# Patient Record
Sex: Female | Born: 1935 | Race: White | Hispanic: No | Smoking: Former smoker
Health system: Southern US, Community
[De-identification: ages and names within clinical notes are randomized; demographics above are authoritative.]

## PROBLEM LIST (undated history)

## (undated) DIAGNOSIS — E039 Hypothyroidism, unspecified: Secondary | ICD-10-CM

## (undated) DIAGNOSIS — K224 Dyskinesia of esophagus: Secondary | ICD-10-CM

## (undated) DIAGNOSIS — I5033 Acute on chronic diastolic (congestive) heart failure: Secondary | ICD-10-CM

## (undated) DIAGNOSIS — B0229 Other postherpetic nervous system involvement: Secondary | ICD-10-CM

## (undated) DIAGNOSIS — J449 Chronic obstructive pulmonary disease, unspecified: Secondary | ICD-10-CM

## (undated) DIAGNOSIS — IMO0002 Reserved for concepts with insufficient information to code with codable children: Secondary | ICD-10-CM

## (undated) DIAGNOSIS — I10 Essential (primary) hypertension: Secondary | ICD-10-CM

## (undated) DIAGNOSIS — I480 Paroxysmal atrial fibrillation: Secondary | ICD-10-CM

## (undated) DIAGNOSIS — B3781 Candidal esophagitis: Secondary | ICD-10-CM

## (undated) DIAGNOSIS — J690 Pneumonitis due to inhalation of food and vomit: Secondary | ICD-10-CM

## (undated) DIAGNOSIS — M329 Systemic lupus erythematosus, unspecified: Secondary | ICD-10-CM

## (undated) HISTORY — DX: Paroxysmal atrial fibrillation: I48.0

## (undated) HISTORY — DX: Acute on chronic diastolic (congestive) heart failure: I50.33

## (undated) HISTORY — DX: Dyskinesia of esophagus: K22.4

## (undated) HISTORY — DX: Candidal esophagitis: B37.81

## (undated) HISTORY — DX: Pneumonitis due to inhalation of food and vomit: J69.0

## (undated) HISTORY — DX: Systemic lupus erythematosus, unspecified: M32.9

## (undated) HISTORY — DX: Reserved for concepts with insufficient information to code with codable children: IMO0002

---

## 1994-05-25 HISTORY — PX: APPENDECTOMY: SHX54

## 1999-01-14 ENCOUNTER — Emergency Department (HOSPITAL_COMMUNITY): Admission: EM | Admit: 1999-01-14 | Discharge: 1999-01-14 | Payer: Self-pay | Admitting: Emergency Medicine

## 1999-01-14 ENCOUNTER — Encounter: Payer: Self-pay | Admitting: Emergency Medicine

## 2000-07-01 ENCOUNTER — Encounter: Admission: RE | Admit: 2000-07-01 | Discharge: 2000-07-01 | Payer: Self-pay | Admitting: Family Medicine

## 2000-07-01 ENCOUNTER — Encounter: Payer: Self-pay | Admitting: Family Medicine

## 2000-09-13 ENCOUNTER — Encounter: Admission: RE | Admit: 2000-09-13 | Discharge: 2000-09-13 | Payer: Self-pay | Admitting: Family Medicine

## 2000-09-13 ENCOUNTER — Encounter: Payer: Self-pay | Admitting: Family Medicine

## 2001-06-24 ENCOUNTER — Encounter: Payer: Self-pay | Admitting: Orthopedic Surgery

## 2001-06-24 ENCOUNTER — Encounter: Payer: Self-pay | Admitting: Emergency Medicine

## 2001-06-24 ENCOUNTER — Inpatient Hospital Stay (HOSPITAL_COMMUNITY): Admission: EM | Admit: 2001-06-24 | Discharge: 2001-06-29 | Payer: Self-pay | Admitting: Emergency Medicine

## 2002-05-16 ENCOUNTER — Encounter: Payer: Self-pay | Admitting: Family Medicine

## 2002-05-16 ENCOUNTER — Encounter: Admission: RE | Admit: 2002-05-16 | Discharge: 2002-05-16 | Payer: Self-pay | Admitting: Family Medicine

## 2002-05-23 ENCOUNTER — Ambulatory Visit: Admission: RE | Admit: 2002-05-23 | Discharge: 2002-05-23 | Payer: Self-pay | Admitting: Family Medicine

## 2002-05-24 ENCOUNTER — Encounter: Admission: RE | Admit: 2002-05-24 | Discharge: 2002-05-24 | Payer: Self-pay | Admitting: Family Medicine

## 2002-05-24 ENCOUNTER — Encounter: Payer: Self-pay | Admitting: Family Medicine

## 2002-05-25 HISTORY — PX: OTHER SURGICAL HISTORY: SHX169

## 2002-07-18 ENCOUNTER — Other Ambulatory Visit: Admission: RE | Admit: 2002-07-18 | Discharge: 2002-07-18 | Payer: Self-pay | Admitting: Family Medicine

## 2002-09-25 ENCOUNTER — Ambulatory Visit (HOSPITAL_COMMUNITY): Admission: RE | Admit: 2002-09-25 | Discharge: 2002-09-25 | Payer: Self-pay | Admitting: Internal Medicine

## 2003-07-23 ENCOUNTER — Other Ambulatory Visit: Admission: RE | Admit: 2003-07-23 | Discharge: 2003-07-23 | Payer: Self-pay | Admitting: Family Medicine

## 2004-08-07 ENCOUNTER — Other Ambulatory Visit: Admission: RE | Admit: 2004-08-07 | Discharge: 2004-08-07 | Payer: Self-pay | Admitting: Family Medicine

## 2004-12-19 ENCOUNTER — Encounter: Admission: RE | Admit: 2004-12-19 | Discharge: 2004-12-19 | Payer: Self-pay | Admitting: Family Medicine

## 2005-09-27 ENCOUNTER — Encounter: Payer: Self-pay | Admitting: Emergency Medicine

## 2005-09-27 ENCOUNTER — Inpatient Hospital Stay (HOSPITAL_COMMUNITY): Admission: EM | Admit: 2005-09-27 | Discharge: 2005-10-03 | Payer: Self-pay | Admitting: Internal Medicine

## 2005-10-12 ENCOUNTER — Encounter: Admission: RE | Admit: 2005-10-12 | Discharge: 2005-10-12 | Payer: Self-pay | Admitting: Family Medicine

## 2005-12-23 ENCOUNTER — Encounter: Admission: RE | Admit: 2005-12-23 | Discharge: 2005-12-23 | Payer: Self-pay | Admitting: Family Medicine

## 2006-07-28 IMAGING — CR DG CHEST 1V PORT
1 series · 1 of 1 positions shown · non-contrast
Comparison: 09/27/05.

CLINICAL DATA: Shortness of breath.
 PORTABLE CHEST - 1 VIEW - 09/28/05:

[view not recorded]
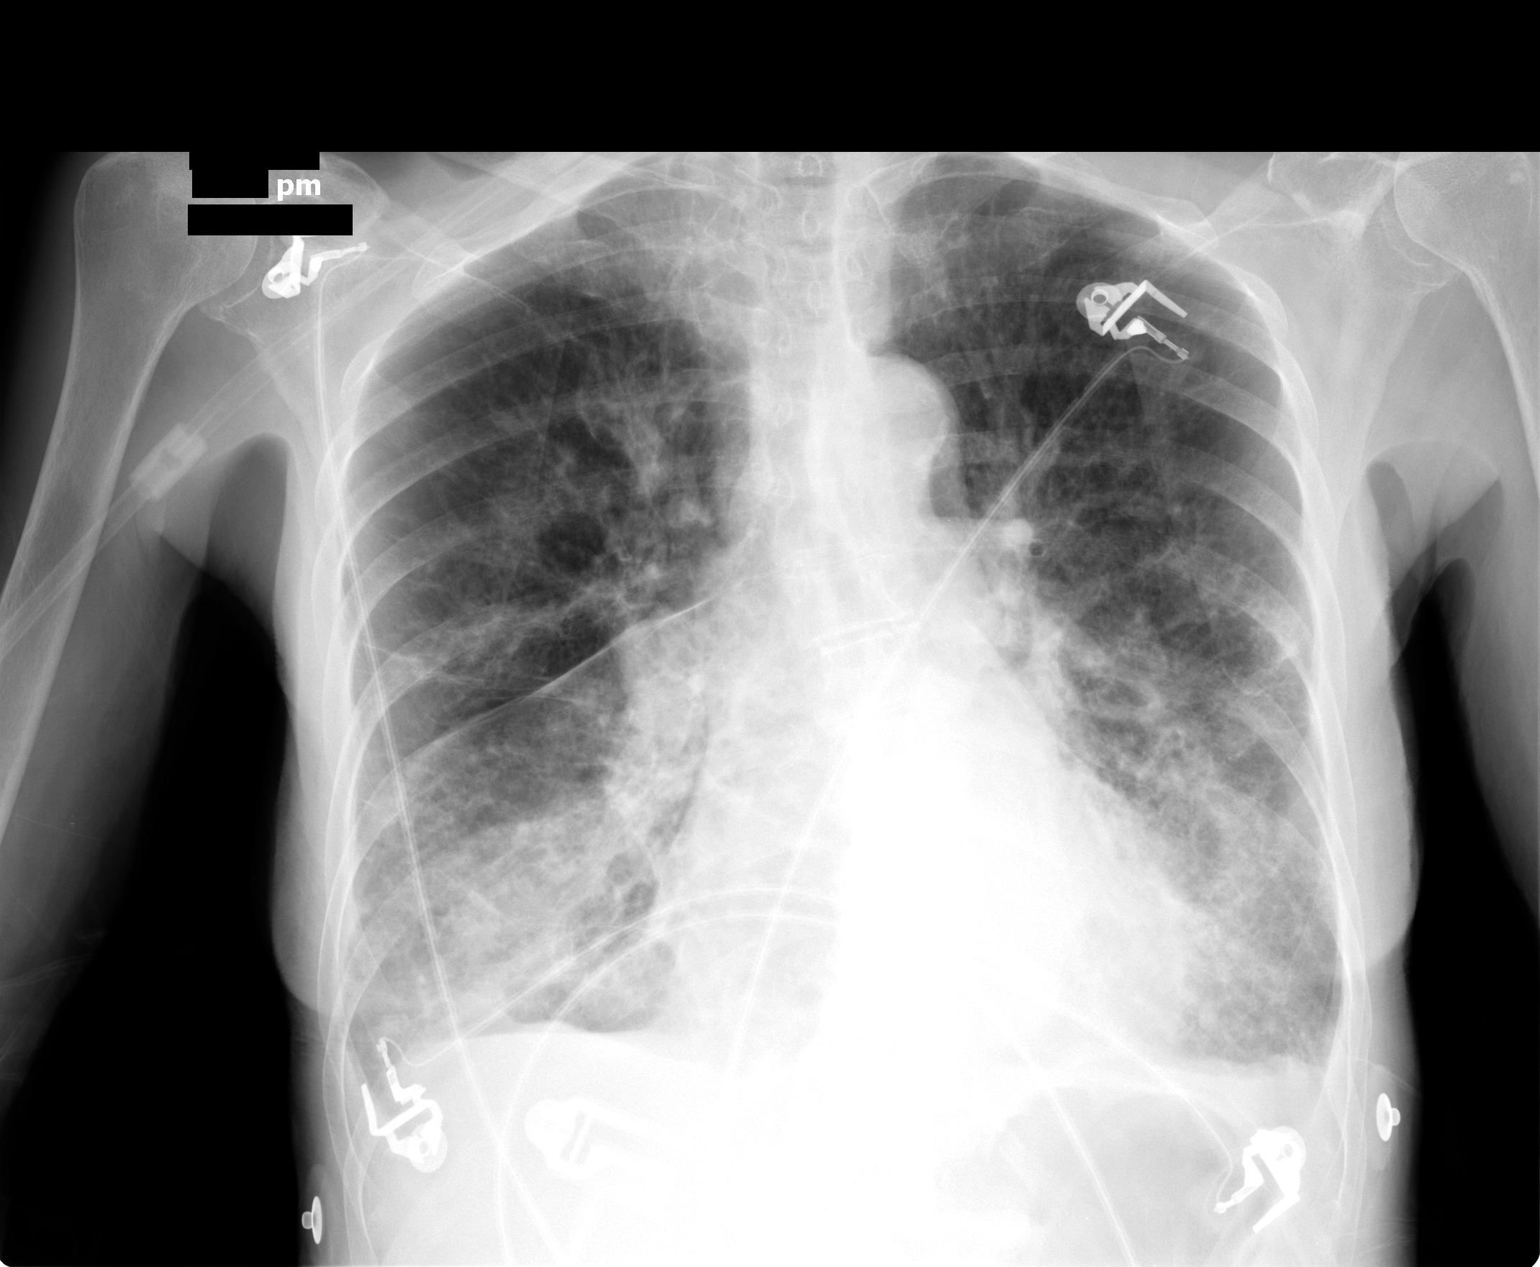

[1 of 1 positions shown; findings below may reference images not displayed]

FINDINGS: Bilateral predominantly lower lobe air space opacities are again noted, unchanged in the interval.  There is blunting of the left costophrenic angle consistent with a small effusion.
IMPRESSION: No significant change in bilateral asymmetric lower lobe infiltrates suggestive of pneumonia or atypical edema.

## 2006-07-30 IMAGING — CR DG CHEST 1V PORT
1 series · 1 of 1 positions shown · non-contrast
Comparison: 09/28/05 at [DATE] p.m.
 PORTABLE CHEST - 1 VIEW, 09/30/05 AT 5141 HOURS:

CLINICAL DATA: Pneumonia.

[view not recorded]
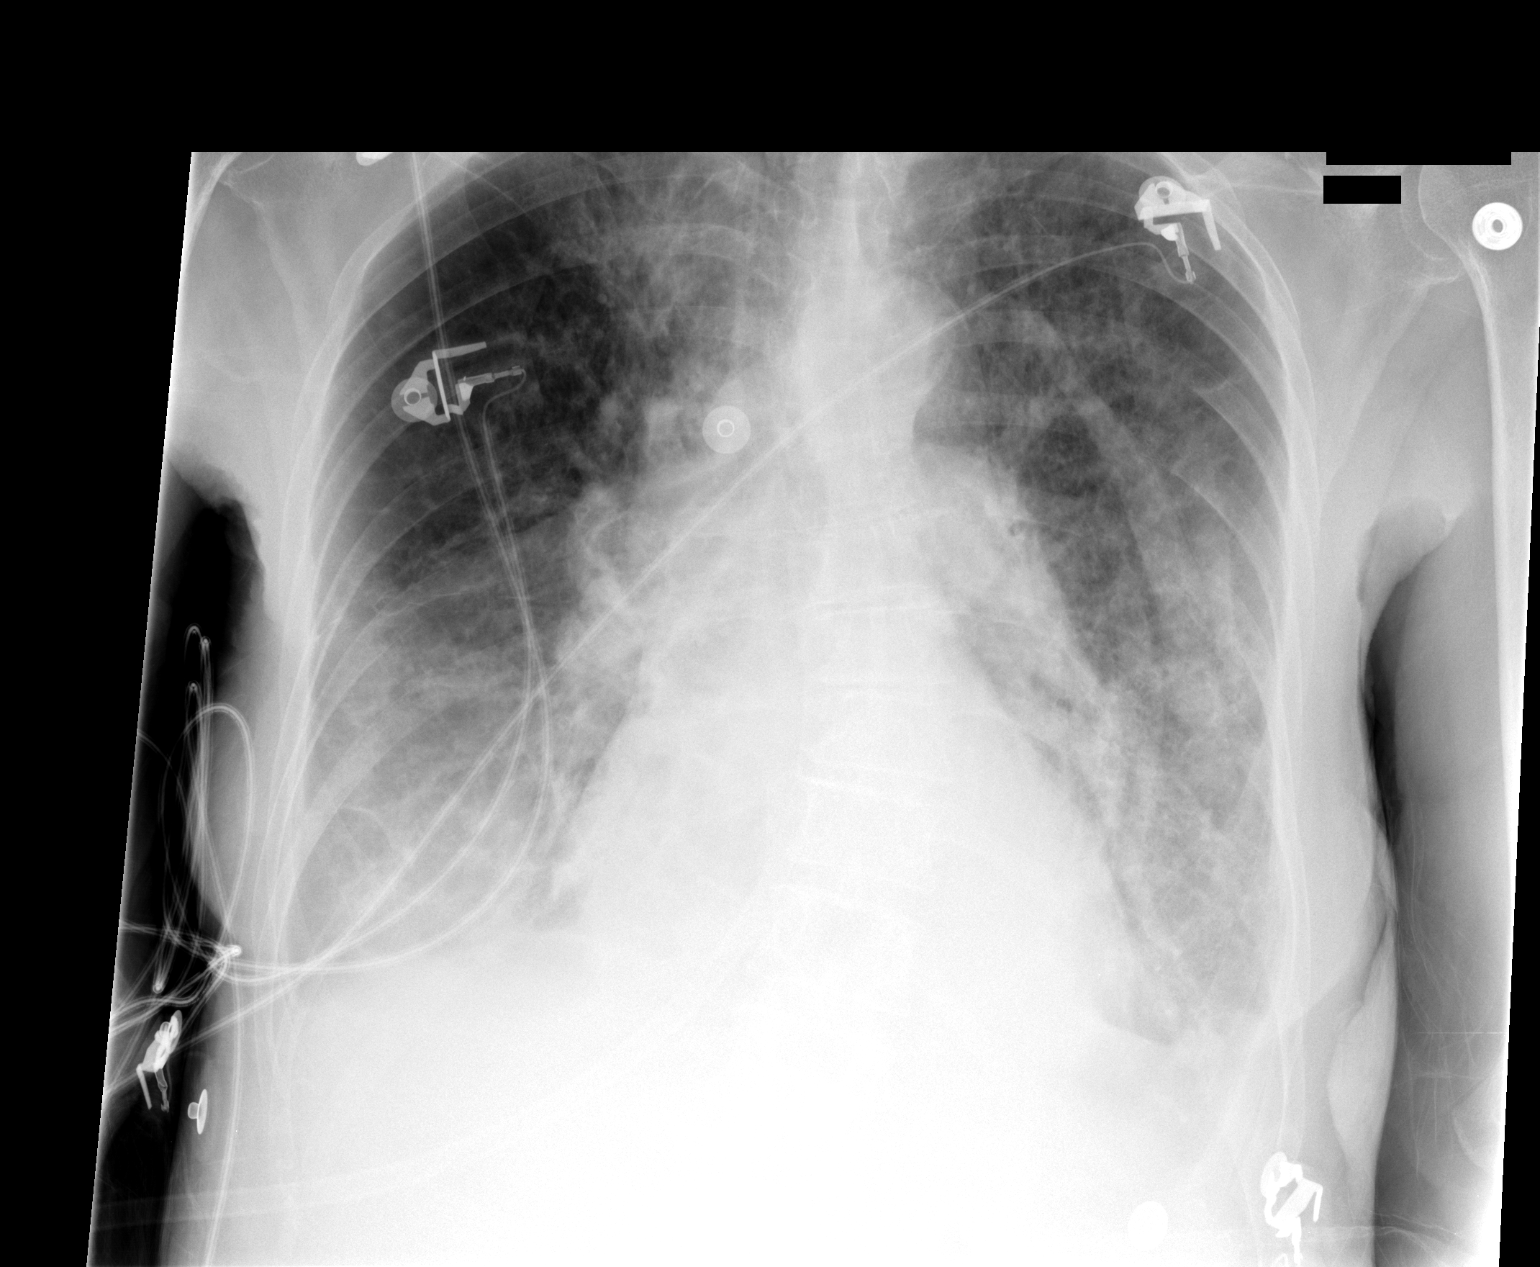

[1 of 1 positions shown; findings below may reference images not displayed]

FINDINGS: Slight progression in diffuse air space disease, most notable lung bases, which may represent infectious infiltrate although component of pulmonary edema may also be present given the degree of pulmonary vascular prominence in the upper lung zones.  Scoliosis.  Cardiomegaly.  Follow-up until clearance recommended to exclude underlying mass.  No pneumothorax.
IMPRESSION: Slight progression of diffuse air space disease as noted above.

## 2006-09-30 ENCOUNTER — Encounter: Admission: RE | Admit: 2006-09-30 | Discharge: 2006-09-30 | Payer: Self-pay | Admitting: Family Medicine

## 2006-11-04 ENCOUNTER — Other Ambulatory Visit: Admission: RE | Admit: 2006-11-04 | Discharge: 2006-11-04 | Payer: Self-pay | Admitting: Family Medicine

## 2007-01-10 ENCOUNTER — Encounter: Admission: RE | Admit: 2007-01-10 | Discharge: 2007-01-10 | Payer: Self-pay | Admitting: Family Medicine

## 2007-07-30 IMAGING — CR DG CHEST 2V
2 series · 2 of 2 positions shown · non-contrast
Comparison: 10/12/05

CLINICAL DATA: Cough and short of breath.  Asthma.  Former smoker.

 CHEST - 2 VIEW:

[view not recorded (1 of 2)]
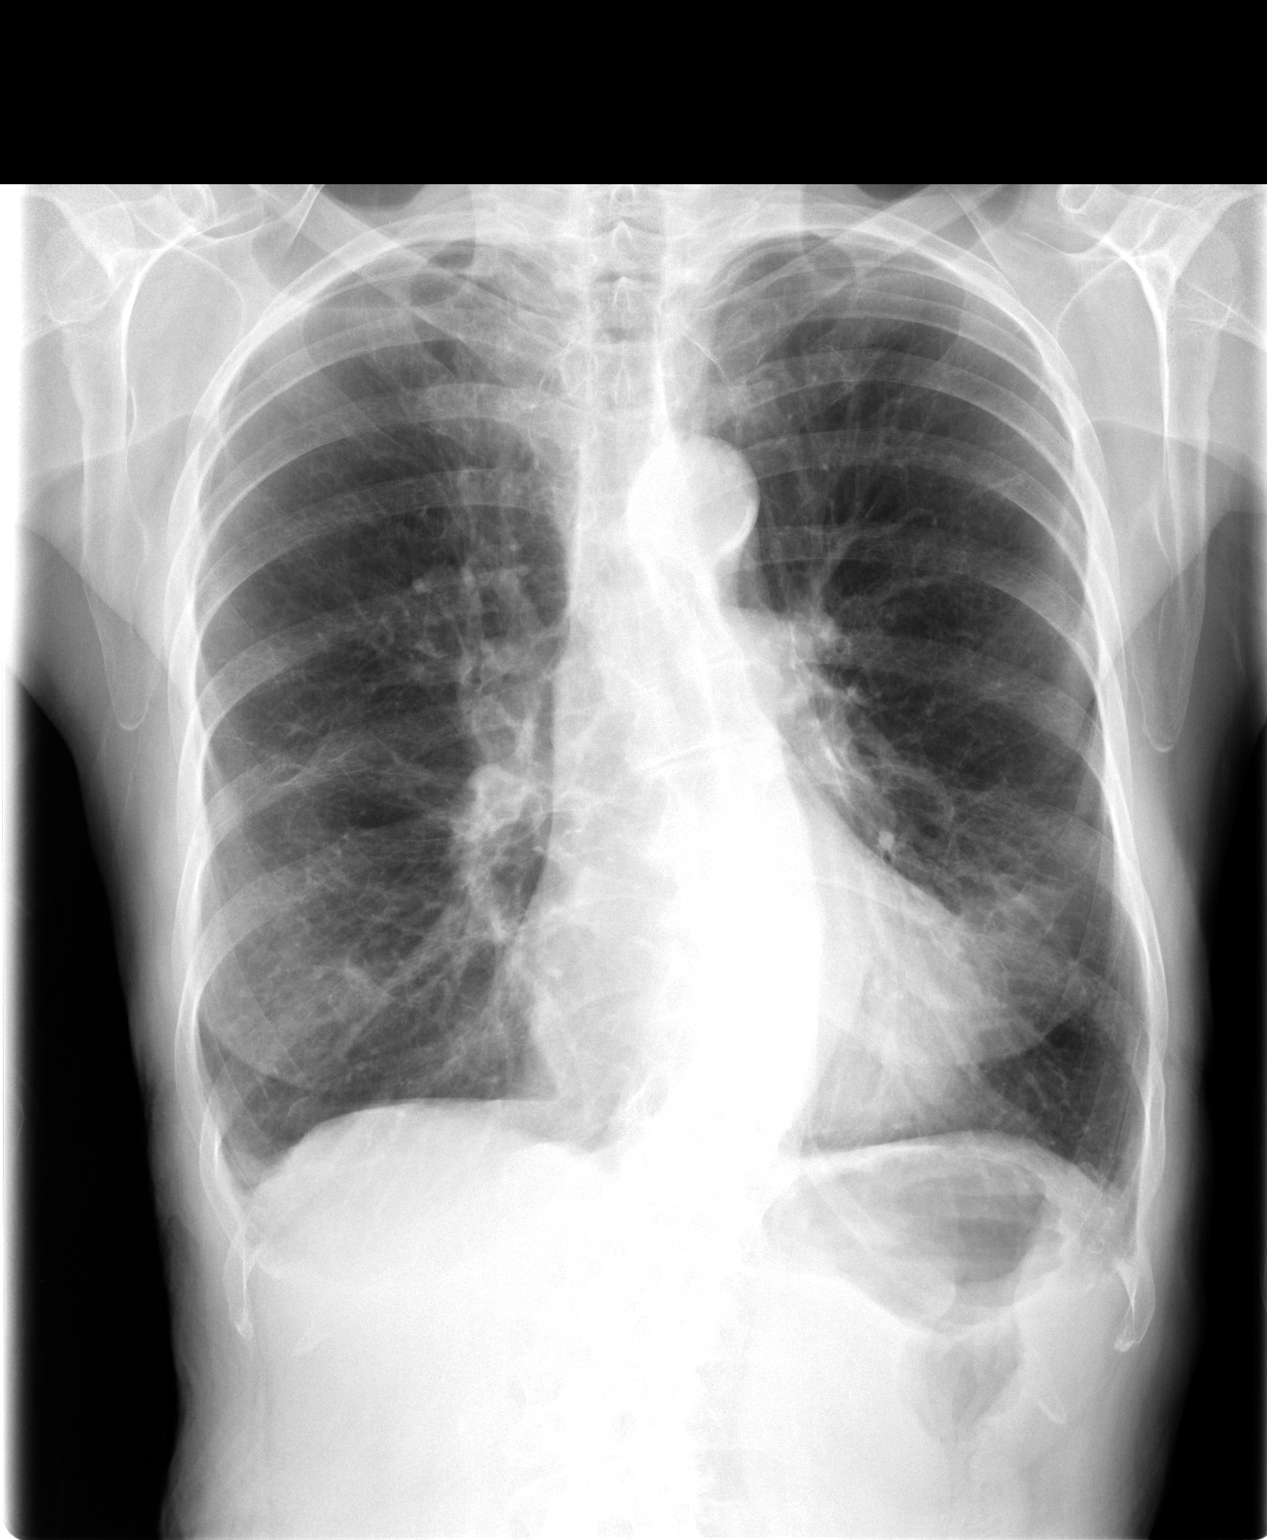

[view not recorded (2 of 2)]
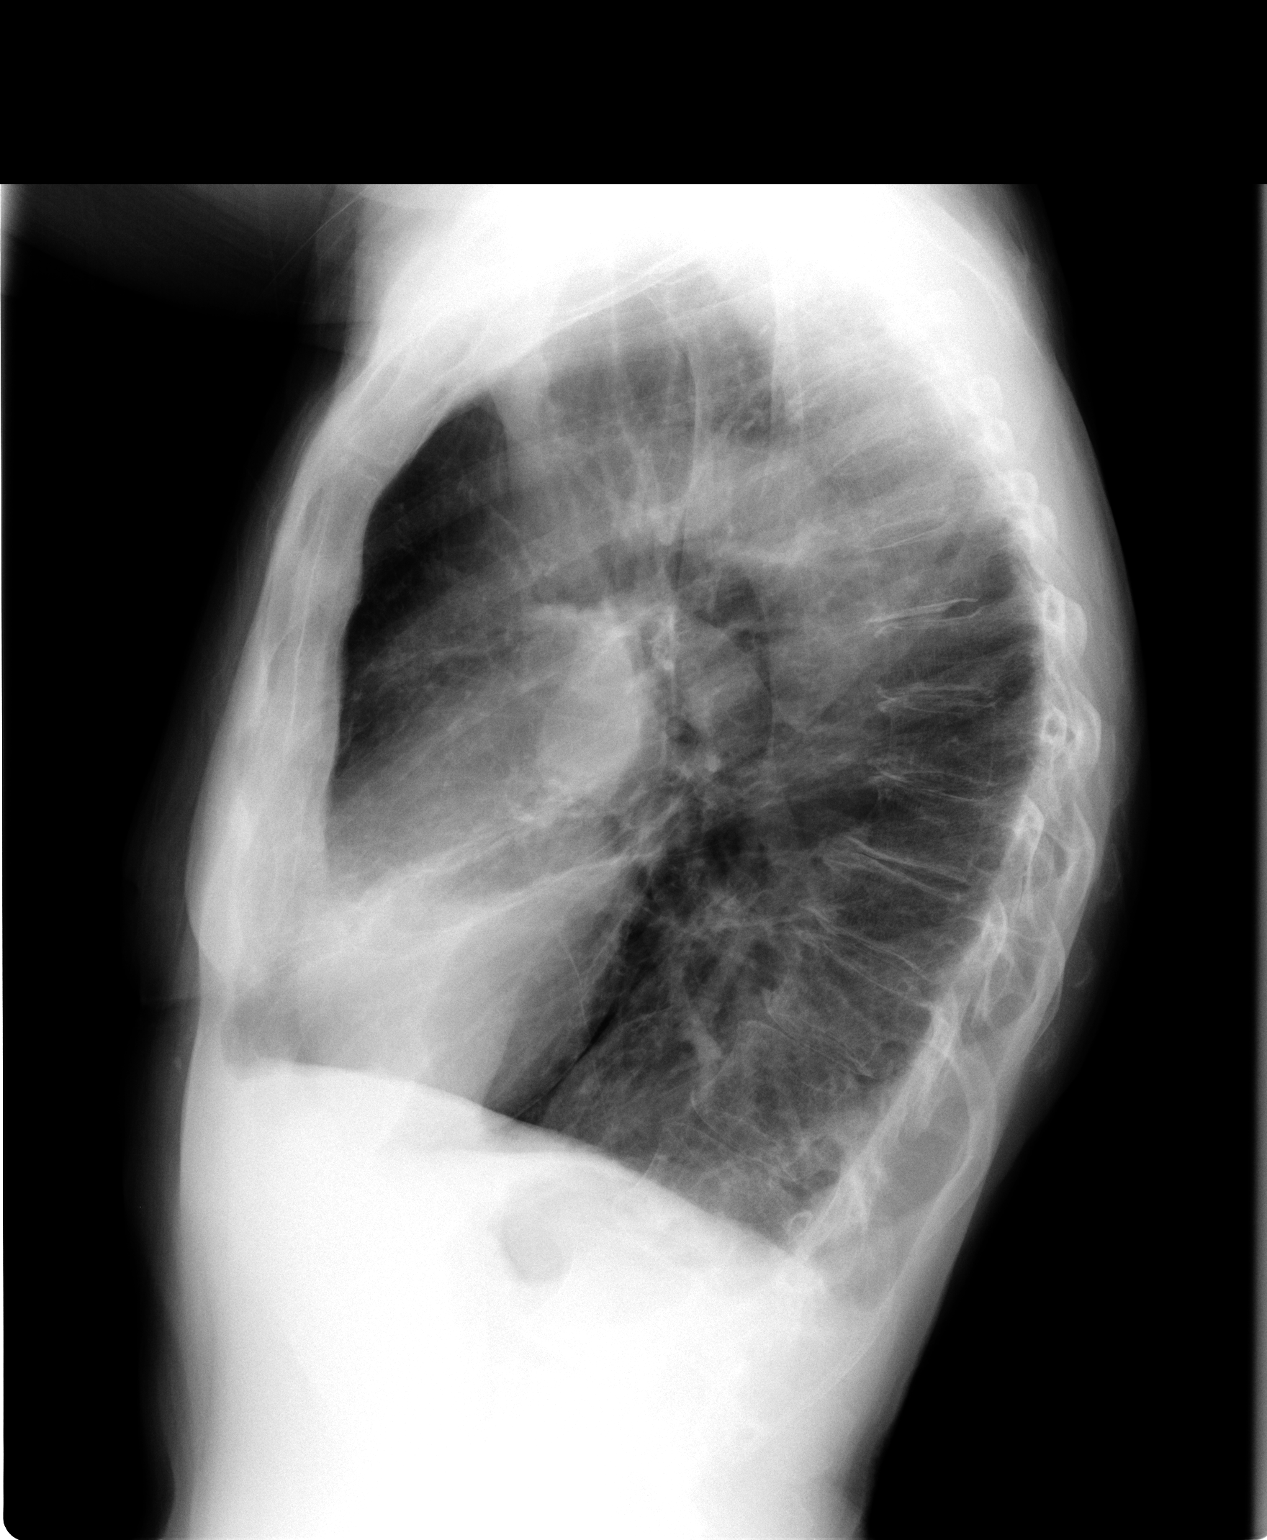

[2 of 2 positions shown; findings below may reference images not displayed]

FINDINGS: Two views of the chest show the lungs to be hyperaerated consistent with COPD.  No active process is seen.  The heart is within normal limits in size.  The bones are osteopenic.
IMPRESSION: COPD.  No active lung disease.

## 2009-01-09 ENCOUNTER — Ambulatory Visit (HOSPITAL_COMMUNITY): Admission: RE | Admit: 2009-01-09 | Discharge: 2009-01-09 | Payer: Self-pay | Admitting: Internal Medicine

## 2010-03-11 ENCOUNTER — Ambulatory Visit (HOSPITAL_COMMUNITY): Admission: RE | Admit: 2010-03-11 | Discharge: 2010-03-11 | Payer: Self-pay | Admitting: Internal Medicine

## 2010-10-10 NOTE — Op Note (Signed)
[place]  Patient:    Michelle Hopkins, Michelle Hopkins Visit Number: [id number] MRN: [id number]          Service Type: MED Location: [phone] Attending Physician:  Cain Sieve Dictated by:   Vania Rea. Supple, M.D. Proc. Date: 06/24/01 Admit Date:  06/24/2001                             Operative Report  PREOPERATIVE DIAGNOSIS: Displaced left femoral neck fracture.  POSTOPERATIVE DIAGNOSIS:  Displaced left femoral neck fracture.  OPERATION:  Cemented [employer]ics bipolar hemiarthroplasty utilizing a #ODC plus stem with a distal cement centralizer and a distal cement plug, a +10 neck length on a 45 mm bipolar head, 26 mm internal head.  SURGEON:  Vania Rea. Supple, M.D.  ASSISTANTDruscilla Brownie. Shela Nevin, P.A.  ANESTHESIA:  General endotracheal.  ESTIMATED BLOOD LOSS: 250 cc.  DRAINS:  None.  INDICATIONS:  Michelle Hopkins is a 77 year old female who fell at home earlier today injuring her left hip with immediate pain and inability to bear weight. On presentation to the emergency room, she was found to have a foreshortened externally rotated left lower extremity with extreme pain on attempts for left hip motion.  Radiographs confirmed a displaced left femoral neck fracture, and she is brought to the emergency room at this time for planned left hip hemiarthroplasty.  Preoperatively, Michelle Hopkins and her family members were counseled on treatment options as well as risks versus benefits and the possible complications of bleeding, infection, neurovascular injury, persistence of the pain, DVT, PE, leg length discrepancy, potential for dislocation and possible need for revision surgery were all reviewed.  She understands, accepts and agreed with our plan.  DESCRIPTION OF PROCEDURE:  After undergoing routine preoperative evaluation, the patient was brought to the operating room, placed supine on the operating table and underwent smooth induction of  general anesthesia.  She turned to the right lateral decubitus position and appropriately padded and protected.  The left hip girdle region was sterilely prepped and draped in the standard fashion.  She received 1 g of IV cephalosporin prophylactically.  A standard posterior approach to the left hip was made through a 12 cm incision, centered over the greater trochanter and curving posteriorly.  Skin flaps were well elevated anteriorly or posteriorly with electrocautery used for hemostasis. The deep fascia was then divided in line with the skin incision and the Charnley retractor was placed.  The gluteus medius was then retracted anteriorly and the piriformis was identified and divided and reflected posteriorly and the remaining portions of the short external rotators were also divided away from their greater trochanter insertion and tagged with #1 Ethibond and reflected posteriorly.  The gluteus medius was then reflected anteriorly.  The capsule was "Td."  Tips of the leaflet were then tagged with a #1 Ethibond suture.  This allowed visualization of the fracture site which was noted to be subcapital.  We removed the femoral head utilizing a cork screw.  The acetabulum itself was in excellent condition.  Copious irrigation was performed.  We measured the head at approximately 45 mm, and we tried a 45 mm head which had excellent fit.  We then turned our attention to the proximal femur where we removed the residual soft tissue from the piriformis fossa and placed a guide pin into the femoral canal and subsequently reamed and placed a canal finder.  Sequential reamings were performed.  We also used a trochanteric reamer to lateralize entrance point.  We then performed sequential reamings up to a size 6 which had a good fit distally.  From this reamer we used the neck cutting guide to perform a neck cut one fingerbreadth above the lesser trochanter.  The cut was then completed, and residual  bone was removed.  We then performed serial broachings up to a size 6 which had excellent fit.  Based on the size of the broach we then placed a distal cement plug.  We pulsatilely lavaged the canal and meticulously cleaned and dried the canal and then mixed the cement into the appropriate consistency.  The cement was introduced in a retrograde fashion and was then pressurized.  The #6 ODC stem was then directed into the canal maintaining proper version.  All extra cement was then meticulously removed.  Once the cement had appropriately hardened we then performed a trial reduction.  The +10 neck length provided excellent soft tissue balance and a full range of motion with good hip stability.  We then meticulously cleaned the trunnion on the implant and impacted the final femoral head and then the bipolar cup and performed the final reduction.  The hip was then taken again through a full range of motion showing good stability.  Clinically, the leg lengths were equal.  We then performed closure in layers with #1 Ethibond for the closure of the capsule, #1 Ethibond to reattach the external rotators through drill holes and the greater trochanter, #1 Vicryl to close the tensor fascia lata and the fascia and 2-0 Vicryl for the subcu, staples used to close the skin.  Xeroform and a dry dressing was then taped over the left hip.  The patient was then rolled supine and clinically the leg lengths were noted to be equal.  The patient was then extubated, transferred to the hospital bed and taken to the recovery room in stable condition. Dictated by:   Vania Rea. Supple, M.D. Attending Physician:  Cain Sieve DD:  06/24/01 TD:  06/26/01 Job: [id number]

## 2010-10-10 NOTE — Op Note (Signed)
Michelle Hopkins, CHO NO.:  [id number]   MEDICAL RECORD NO.:  [id number]                   PATIENT TYPE:  OIB   LOCATION:  2856                                 FACILITY:  [facility]   PHYSICIAN:  Duke Salvia, M.D.               DATE OF BIRTH:  Oct 02, 1926   DATE OF PROCEDURE:  09/25/2002  DATE OF DISCHARGE:                                 OPERATIVE REPORT   PREOPERATIVE DIAGNOSIS:  Supraventricular tachycardia.   POSTOPERATIVE DIAGNOSIS:  Slow-fast atrioventricular nodal reentrant  tachycardia.   PROCEDURE PERFORMED:  Invasive electrophysiologic study, arrhythmia mapping,  isoproterenol infusion and radiofrequency catheter ablation.   DESCRIPTION OF PROCEDURE:  Following obtaining of informed consent, the  patient was brought to the electrophysiology laboratory and placed on the  fluoroscopic table in the supine position.  After routine prep and drape,  cardiac catheterization was performed with local anesthesia and conscious  sedation.  Noninvasive blood pressure monitored and transcutaneous oxygen  saturation monitoring and end tidal CO2 monitoring were performed  continuously throughout the procedure.  Following the procedure, the  catheters were removed, hemostasis was obtained and the patient was  transferred to the floor in stable condition.   Catheter was a 5 [place] quadripolar catheter:  A 5 French quadripolar  catheter was inserted in the left femoral vein at the AV junction.  A 5  French quadripolar catheter was inserted via the left femoral vein to the  high right atrium.  A 5 French quadripolar catheter was inserted in the left  femoral vein to the right ventricular apex.  A 6 French octapolar catheter  was inserted via the right femoral vein to the coronary sinus.  A 7 French 4  mm deflectable tip catheter was inserted via the right femoral vein using  SL4 sheath to mapping sites in the posterior septal space.   Surface leads I, aVF,  and V1 were monitored continuously throughout the  procedure.  Following insertion of the catheters, the stimulation protocol  included incremental atrial pacing, incremental ventricular pacing, single  atrial extrastimuli with pace cycle length of 600, 500 and 400 ms.  Double  atrial stimuli at paced cycle length of 500 and 450 and 400 ms.  Single  ventricular extrastimuli at paced cycle length of 600 ms.  The above were  done in the absence of and in the presence of isoproterenol.   RESULTS:  __________ ELECTROCARDIOGRAM AND INTRACARDIAC INTERVALS                          Initial                       Final  Rhythm:                 Sinus initial  Sinus final  Cycle length:           829 ms initial          897 ms final  PR interval:            125 ms initial          124 ms final  QRS duration:           87 ms initial                 88 ms final  QT interval:                  427 ms initial          391 ms final  P wave duration:              99 ms initial                 86 ms final  Bundle branch block:    Absent                  Absent  Pre-excitation:         Absent                  Absent   His AH interval:        54 ms initial           47 ms final  H interval:             36 ms initial                 35 ms final  His bundle duration:    9 ms initial                  10 ms final.   AV NODAL FUNCTION:  AV Wenckebach was 370 ms.  VA Wenckebach was 330 ms.  AV nodal effective refractory period paced cycle length of 500 ms in the  absence of isoproterenol was 340 ms and a slow pathway functional refractory  period was 330 ms so there was really a very small window.   AV nodal conduction preablation was discontinued from an echo beat and  retrograde  conduction was also discontinuous.   Following ablation, AV nodal conduction was continuous.   ACCESSORY PATHWAY FUNCTION:  No evidence of an accessory pathway was  identified.   VENTRICULAR RESPONSE TO PROGRAM  STIMULATION:  Normal for ventricular  stimulation as described above.   ARRHYTHMIAS INDUCED:  AV nodal reentrant tachycardia was reproducibly  inducible either with atrial pacing or double atrial extrastimuli at  400:250:270-290.  It could be terminated with atrial pacing at 260 ms.   During a typical episode of tachycardia:  A.  The earliest atrial activation was in the His bundle electrogram.  B.  No atrial  pre-excitation was seen during ventriculation with His bundle  refractoriness.  C.  During a typical episode, the H interval was 26 ms, the  VA time was -12 ms.  The AH interval was 259 ms.  D.  There was a VAV response.   FLUOROSCOPY TIME:  A total of less than 5 minutes of fluoroscopy time was  utilized at 7.5 to 15 frames per second.   RADIOFREQUENCY ENERGY:  A total of 47 seconds of radio frequency energy was  applied in two separate applications and two sites for potential pathway  potentials were identified.  In fact, the electrograms here were quite  distinct and this site was picked because it was caudal to the  caudal/posterior to the coronary sinus os.  Junctional tachycardia ensued  and the catheter was withdrawn through three distinct sites, each of which  received at least 10 seconds of radio frequency energy with the induction of  junctional tachycardia.  Following the last application of radio frequency  energy, no evidence of antegrade flow pathway function was identified in the  presence or in the absence of isoproterenol.   IMPRESSION:  1. Normal sinus function.  2. Normal atrial function.  3. Dual antegrade AV nodal physiology with inducible slow-fast AV nodal re-     entry tachycardia.  4. Normal  His-Purkinje system function.  5. No accessory pathways.  6. Normal ventricular response to programmed stimulation.   SUMMARY:  In conclusion, the result of electrophysiologic testing confirmed AV nodal re-entry tachycardia as the mechanism underlying the  patient's  clinical arrhythmia.  RF energy applied to the region of slow pathway  successfully eliminated antegrade slow pathway conduction and thereby the  substrate for the patient's clinical tachycardia.   RECOMMENDATIONS:  Will observe the patient overnight.  Aspirin daily for six weeks.  Endocarditis prophylaxis daily for three months.   The patient tolerated the procedure well without apparent complications.                                                Duke Salvia, M.D.    SCK/MEDQ  D:  09/25/2002  T:  09/25/2002  Job:  [id number]   cc:   Quita Skye. Artis Flock, M.D.  [address]  [place]  [place] [address]  Fax: [phone]   Charlaine Dalton. Sherene Sires, M.D. [facility]

## 2010-10-10 NOTE — Discharge Summary (Signed)
[place]. [facility]  Patient:    Michelle Hopkins, Michelle Hopkins Visit Number: [id number] MRN: [id number]          Service Type: MED Location: [phone] Attending Physician:  Cain Sieve Dictated by:   Dorie Rank, P.A.C. Admit Date:  06/24/2001 Discharge Date: 06/29/2001                             Discharge Summary  ADMISSION DIAGNOSES: 1. Displaced left femoral neck fracture. 2. Mild gastroesophageal reflux disease. 3. History of emphysema and bronchitis.  DISCHARGE DIAGNOSES: 1. Left femoral neck fracture, status post bipolar hemiarthroplasty. 2. Gastroesophageal reflux disease. 3. History of emphysema and bronchitis. 4. Supraventricular tachycardia while in the hospital - resolved.  PROCEDURE:  On June 24, 2001, the patient was taken to the operating room and underwent a cemented bipolar hemiarthroplasty.  Surgeon was Vania Rea. Supple, M.D., assistant was Cherly Beach, P.A.C.  ANESTHESIA:  General anesthesia.  COMPLICATIONS:  None.  CONSULTS:  [facility].  HISTORY OF PRESENT ILLNESS:  This is a pleasant 77 year old white female who was at home on June 24, 2001, and slipped on the ice while retrieving her paper. She had immediate pain in the left hip and she was unable to stand. She called her family who then called an ambulance and brought her to the [facility] Emergency Room for further evaluation and treatment. Radiographs revealed a displaced left femoral neck fracture.  Dr. Rennis Chris was on orthopedics call and I called him to evaluate the patient. It was felt she would benefit from bipolar hemiarthroplasty on the left.  Risks and benefits as well as procedure were discussed with the patient.  After she was cleared by her family medical doctor, she was admitted for surgery.  HOSPITAL COURSE:  The patient had the above stated surgery without complications.  While in the operating room the incision was  dressed in a sterile fashion.  This was clean, dry and intact on postop day #1. Her dressing was changed daily thereafter and found to be free of any erythema or drainage.  Appropriate IV antibiotics were used postoperatively for infection control.  She was placed on Coumadin for DVT prophylaxis postoperatively. This was monitored by the pharmacy throughout.  She utilized a Demerol PCA. She did have some nausea with this and this was discontinued by postop day #1 and she utilized p.o. pain medications for pain control throughout the remainder of her hospital stay.  Regular diet was resumed postoperatively. She had good intake while in the hospital.  Home medications were resumed and incentive spirometry was encouraged throughout her hospital stay. Physical therapy and occupational therapy worked with the patient. She was weightbearing as tolerated to that lower extremity.  She utilized oxygen for the first 24 hours and then was weaned off of this by postop day #2. Foley catheter was placed while in the operating room. This was discontinued by postop day #2 and she voided on her own throughout the remainder of the hospital stay.  Rehab consult was obtained as she was slow to progress with physical therapy initially; however, toward the end of her hospital stay, it was felt she could be independent enough for discharge home.  BMET was checked daily.  Hemoglobin and hematocrit were checked daily for the first three days postoperatively and were found to be stable.  On her BMET, her potassium did drop down to 2.9 on June 28, 2001. She was given oral medications and this did resolve and it was up to 4.3 on June 29, 2001.  She did have an episode of SVTs while in the hospital so consult was obtained through [facility].  Please see their consult for full description of their work-up. On June 29, 2001, after clearance from cardiology, the patient was felt to be medically and  orthopedically stable for discharge.  LAB VALUES:  H&H from June 24, 2001, was 15.0 and 44.2.  On June 26, 2001, hemoglobin was 10.5, hematocrit 31.0.  On June 28, 2001, hemoglobin 10.8, hematocrit 32.3.  PT/INR on June 24, 2001, within normal limits.  On June 29, 2001, PT was 21.6, INR 2.3.  BMET on June 24, 2001, within normal limits.  On June 27, 2001, BMET was normal.  Potassium, as mentioned above, did fall on June 28, 2001 to 2.9; after supplementation, potassium was back to 4.3 on June 29, 2001.  Liver function studies from June 24, 2001, within normal limits.  Cardiac enzymes from June 28, 2001, revealed a CK 138, CK-MB 2.1, relative index 1.6, troponin 0.11.  UA from June 24, 2001, revealed hazy urine, 15 ketones and trace leukocyte esterase, otherwise normal.  RADIOLOGY:  Portable one-view left hip x-ray from June 24, 2001, revealed normal postop appearance of the left total hip replacement.  Films from June 24, 2001, of the pelvis and left hip revealed a left hip fracture with mild to moderate degenerative changes of the bilateral hips.  CARDIOLOGY:  Tracings from June 24, 2001, showed normal sinus rhythm, confirmed by Jaclyn Prime. Lucas Mallow, M.D.  On June 28, 2001, tracings revealed supraventricular tachycardia that was new since June 24, 2001.  ST depression confirmed by Erskine Speed, M.D.  CONDITION ON DISCHARGE:  Improved.  DISCHARGE PLAN AND MEDICATION:  The patient was discharged to home. She was to have home health PT and OT with [facility] as well as Coumadin therapy.  She was given prescriptions for Darvocet, Robaxin and Coumadin. She was to resume her home medications and Toprol as directed by Luis Abed, M.D.  Shower postop day #5 with assistance. Daily dressing changes.  Weightbearing as tolerated to left lower extremity.  Hip precautions.  Regular diet.  Follow-up with Dr. Rennis Chris in two weeks from surgery date.  Call for an appointment. Call should she have any questions or concerns prior to her discharge home. Dictated by:   Dorie Rank, P.A.C.  Attending Physician:  Cain Sieve DD:  07/27/01 TD:  07/28/01 Job: [id number]

## 2010-10-10 NOTE — Consult Note (Signed)
. [facility]  Patient:    Michelle Hopkins, Michelle Hopkins Visit Number: [id number] MRN: [id number]          Service Type: MED Location: [phone] Attending Physician:  Michelle Hopkins Dictated by:   Michelle Hopkins, M.D., [facility] Admit Date:  06/24/2001 Discharge Date: 06/29/2001   CC:         Michelle Hopkins, M.D.             Michelle Hopkins, M.D. [facility]                          Consultation Report  REASON FOR CONSULTATION:  Tachycardia.  HISTORY OF PRESENT ILLNESS:  Michelle Hopkins is a 77 year old woman admitted for hip fracture after falling on the ice, status post hip arthroplasty, who lastnight had the onset of tachypalpitations.  She has had similar palpitations approximately 50 years occurring three or four times per year, lasting typically 10 to 20 minutes, but on one occasion lasting up to five hours.  Symptoms associated have been lightheadedness, but without presyncope, mild shortness of breath, and chest discomfort, and the patient has taken Toprol as an outpatient for these for years with relatively good effect (ie, limited symptoms).  Last night with recurrent symptoms, an electrocardiogram was obtained which demonstrated supraventricular tachycardia at 190 beats per minute.  Her cardiac risk factors are negative for hypertension and diabetes. Her cholesterol status is not known.  She says her total cholesterol is 212, but has a good HDL.  She does not smoke cigarettes and there is no family history of heart disease.  She does have exercise intolerance that has been progressive over the last couple of years and worsening again over the last two months.  Says she is now limited to less than a flight of stairs and less than 100 yards.  Relative to walking with her husband, her husband can now clearly outwalk her.  She also has two pillow orthopnea which is new over the same time period.  As well she denies peripheral edema.  She also denies  exercise discomfort of her chest, neck, shoulders, arms, wrists, or exercise associated palpitations.  Her cardiac risk factors are as noted.  She has a history of GE reflux disease and a history of COPD/asthma.  PAST SURGICAL HISTORY:  As noted.  MEDICATIONS: 1. Protonix 40 mg. 2. K-Dur 20 mEq three times a day? 3. Coumadin post hip replacement.  ALLERGIES:  CODEINE.   REVIEW OF SYSTEMS:  As noted on the intake sheet and is broadly negative.  She denies a history of presyncope noted on the nurse practitioner note.  SOCIAL HISTORY:  She lives with her husband.  She has three children.  PHYSICAL EXAMINATION:  GENERAL:  She is a well-developed, elderly, Caucasian female in no acute distress.  VITAL SIGNS:  Blood pressure 110/70, pulse 70, respirations 60 and nonlabored.   HEENT:  No scleral xanthoma.  Her neck veins are 5 to 6 cm.  Her carotid upstrokes were brisk bilaterally without bruits.  There was no lymphadenopathy.  SKIN:  Warm and dry.  BACK:  Without kiphosis or scoliosis.  LUNGS:  Clear.  HEART:  Sounds were regular without murmurs or gallops.  ABDOMEN:  Soft with active bowel sounds.  There were a couple of well-healed scars.  No hepatomegaly or mildine pulsation was appreciated.  EXTREMITIES:  Femoral pulses were 2+, distal pulses were intact.  There was  no clubbing, cyanosis, or edema.  NEUROLOGICAL:  Grossly normal.  EKG demonstrated sinus rhythm at 75 with intervals of 0.18/0.08/0.36 with an axis that is mildly rightward at 106 degrees on admission electrocardiogram.  Electrocardiogram last night demonstrated a rate of 193 with intervals of heightened 0.08/0.23 with an R prime in lead V1 consistent with AV nodal reentry.  Repeat electrocardiograms on January 31 had demonstrated no evidence of right axis deviation raising the question as to whether the first electrocardiogram was a lead positioning error.  IMPRESSION: 1. Supraventricular  tachycardia probably AV nodal reentry with a cycle length    of 300 milliseconds. 2. Dyspnea on exertion progressive over the last couple of years with two    pillow orthopnea. 3. Diagnosis of asthma/COPD. 4. Cardiac risk factors negative for hypertension, negative for diabetes,    negative for family history, question cholesterol. 5. Status post hip arthroplasty.  Michelle Hopkins has supraventricular tachycardia that is quite rapid.  Not withstanding this however, her symptoms are really quite limited and she has been doing this for a number of years.  We discussed therapeutic options including daily drugs versus p.r.n. drugs versus RF catheter ablation.  At the present time, she would like to continue on her current course as her symptoms are quite familiar to her and not severe.  It is a decision with which I concur.  I am, however, concerned about her exercise intolerance that has been progressive.  Not withstanding her lack of risk factors, I wonder whether the ischemia is contributing to this and especially with the concommitant orthopnea.  I wonder if she has diastolic dysfunction as the final pathway. Dr. Myrtis Ser has ordered an echo and we will undertake a stress Cardiolite scan in the office in a couple of weeks to look at this.  Thank you very much for this consultation. Dictated by:   Michelle Hopkins, M.D., [facility] Attending Physician:  Michelle Hopkins DD:  06/29/01 TD:  06/30/01 Job: [id number]

## 2010-10-10 NOTE — H&P (Signed)
NAMEKERSTIN, CRUSOE NO.:  [id number]   MEDICAL RECORD NO.:  [id number]          PATIENT TYPE:  EMS   LOCATION:  ED                           FACILITY[facility]  [facility]   PHYSICIAN:  Lonia Blood, M.D.DATE OF BIRTH:  [birth year]   DATE OF ADMISSION:  09/27/2005  DATE OF DISCHARGE:                                HISTORY & PHYSICAL   PRIMARY CARE PHYSICIAN:  Dr. Bradd Hopkins   CHIEF COMPLAINT:  Shortness of breath with lethargy.   HISTORY OF PRESENT ILLNESS:  [name] is a very pleasant 77-year-  old former smoker who carries a diagnosis of emphysema, who began to  complain of stomach ache approximately 1 week ago. This was described as a  cramping of the stomach and some loose bowel movements. Symptoms persisted  for approximately 2 days and then resolved. This was shortly thereafter  followed by shortness of breath and a cough. The patient reports the cough  as productive of thick yellowish phlegm. The patient has had subjective  fevers and chills intermittently. She has noted significant shortness of  breath particularly when exerting herself. Symptoms have worsened over the  past 4-5 days to the point that she is now short of breath when resting. She  does in fact have to interrupt sentences to catch her breath. Because of the  severity of her symptoms and the fact that they were progressing, she  presented to the emergency room today for further evaluation.   REVIEW OF SYSTEMS:  [name] adamantly denies chest pain at the present  time. There is no nausea or vomiting. Her diarrhea has resolved. There is no  headache. A comprehensive review of systems is otherwise unremarkable with  the exception of positive elements noted in the history of present illness  above.   PAST MEDICAL HISTORY:  1.  Status post appendectomy.  2.  Status post total hip replacement on the left in January, 2003 status      post fall on the ice.  3.  History of  supraventricular tachycardia status post radiofrequency      catheter ablation per Dr. Graciela Hopkins, May, 2004.  4.  Gastroesophageal reflux disease.  5.  Chronic obstructive pulmonary disease with a long-standing history of      tobacco abuse, discontinued at age 7.  6.  Hypothyroidism.   OUTPATIENT MEDICATIONS:  (doses unknown).  1.  Advair Diskus.  2.  Prilosec.  3.  Synthroid.   ALLERGIES:  Codeine.   FAMILY HISTORY:  Noncontributory secondary to age.   SOCIAL HISTORY:  The patient lives in [place]. She does not presently  smoke. She does not drink alcohol. She is married. She has three children  total.   DATA REVIEWED:  White blood count is markedly elevated at 31,000,  hemoglobin, MCV and platelet count are normal. Sodium is low at 130,  potassium is low at 2.9, chloride and bicarb are normal, BUN is elevated at  22.  Creatinine is normal at 0.8. Calcium and serum glucose are normal  though serum glucose is borderline elevated. Liver function tests are all  normal.  Albumin is low at 2.2. BNP is 281.   Chest x-ray reveals significant bilateral lower lobe infiltrates.   PHYSICAL EXAMINATION:  VITAL SIGNS:  Temperature 97.2, blood pressure  112/73, heart rate 93, respiratory rate 34, O2 saturations 83% on room air  and 100% on 3 liters/minute nasal cannula.  GENERAL:  Thin frail appearing female who is in acute respiratory distress  and is unable to complete full sentences because of tachypnea.  HEENT:  Normocephalic, atraumatic. Pupils are equal, round and reactive to  light and accommodations. Extraocular movements are intact bilaterally.  OC/OP clear with exception of dry mucous membranes.  NECK:  No JVD, no lymphadenopathy.  LUNGS:  Coarse bibasilar crackles with mild expiratory wheeze throughout all  fields but poor air movement in upper fields bilaterally.  CARDIOVASCULAR:  Tachycardic at approximately 100 beats per minute but  regular without gallop or rub.  ABDOMEN:   Thin, nontender, nondistended, soft, bowel sounds present. No  hepatosplenomegaly, no rebound, no ascites.  EXTREMITIES:  No significant clubbing, cyanosis or edema in bilateral lower  extremities.  NEUROLOGIC:  Nonfocal neurologic exam.   IMPRESSION AND PLAN:  1.  Severe bibasilar community acquired pneumonia - [name] will be      placed in a telemetry bed. She will require empiric intravenous      antibiotics using Rocephin and Azithromycin. Tylenol will be offered as      needed. Will obtain blood cultures and attempt to identify the offending      agent. Will also request a sputum culture.  2.  Acute chronic obstructive pulmonary disease  exacerbation - [name]      has a history of asthma but clearly what she has in fact is COPD. She      is suffering from an acute exacerbation now with decreased air movement      throughout and appreciable expiratory wheeze. I will refrain from using      steroids initially as I do not wish to inhibit her immune response to      her significant pneumonia. We will administer albuterol and Atrovent on      a standing basis. We will follow her exam closely and will need to      consider steroid therapy if she does not improve. An ABG will also be      obtained now to assure there is not a significant degree of CO2      retention at this point. The patient has discontinued tobacco abuse.  3.  Hypokalemia - patient is suffering with a significant hypokalemia. We      will replete the patient with IV potassium via her IV fluid as well as      orally 40 mEq p.o. q.12 hours. We will also check a magnesium level.  4.  Dehydration with hyponatremia - the patient's hyponatremia is felt to be      result of a hypovolemia. Her intake has been poor and she did have      earlier GI losses due to some loose stools approximately 1 week ago. We      will      hydrate the patient carefully with IV fluid and follow her BUN and      creatinine ratio. 5.   Hypothyroidism - we will continue Synthroid. I will check a TSH. We will      need to adjust her dosing once her outpatient dose can be confirmed but  for now will treat her with a basic entry level of 100 Mcg p.o. daily.      Lonia Blood, M.D.  Electronically Signed     JTM/MEDQ  D:  09/27/2005  T:  09/27/2005  Job:  [id number]   cc:   Quita Skye. Artis Flock, M.D.  Fax: [phone]

## 2010-10-10 NOTE — Discharge Summary (Signed)
Michelle Hopkins, Michelle Hopkins NO.:  [id number]   MEDICAL RECORD NO.:  [id number]          PATIENT TYPE:  INP   LOCATION:  5505                         FACILITY:  [facility]   PHYSICIAN:  Hillery Aldo, M.D.   DATE OF BIRTH:  1927-01-22   DATE OF ADMISSION:  09/27/2005  DATE OF DISCHARGE:  10/03/2005                                 DISCHARGE SUMMARY   PRIMARY CARE PHYSICIAN:  Dr. Artis Flock.   DISCHARGE DIAGNOSES:  1.  Bilateral pneumonia.  2.  Acute chronic obstructive pulmonary disease exacerbation.  3.  Hypothyroidism.  4.  Hypokalemia.  5.  Dehydration.  6.  Hyponatremia.  7.  Mild anemia.  8.  Steroid-induced hyperglycemia.  9.  Gastroesophageal reflux disease.   DISCHARGE MEDICATIONS:  1.  Prednisone taper 60 mg off over 6 days.  2.  Synthroid 75 mcg daily.  3.  Prilosec 20 mg daily.  4.  Mucinex 600 mg b.i.d. p.r.n.  5.  Albuterol 2 puffs q.6h. and q.2h. p.r.n.  6.  Atrovent 2 puffs q.6h.  7.  Avelox 400 mg daily x4 more days.  8.  Xanax 0.5 mg b.i.d. p.r.n.  9.  Advair at prior dosage.   CONSULTATIONS:  None.   PROCEDURES AND DIAGNOSTIC STUDIES:  1.  Chest x-ray on Sep 27, 2005 showed bilateral asymmetric lower lobe      infiltrate suggestive of pneumonia versus atypical edema.  2.  Chest x-ray on Sep 28, 2005 showed no significant change and bilaterally      symmetric lower lobe infiltrate.  3.  Chest x-ray on Sep 30, 2005 showed slight progression of diffuse air      space disease.   DISCHARGE LABORATORY VALUES:  White blood cell count was 12.3, hemoglobin  12.4, hematocrit 36.9, platelets 379.  Sodium is 137, potassium 4.0,  chloride 103, bicarb 27, BUN 14, creatinine 0.7, glucose 83.  Blood cultures  were negative x2.  Sputum culture grew rare gram-positive cocci in pairs and  candida.  Urine culture was negative.   BRIEF ADMISSION HPI:  The patient is a 77 year old female who presented to  the emergency department with complaints of upper abdominal  pain that  progressed to include dyspnea and cough.  She also reported fever and  chills.  Upon initial evaluation, she was found to be dyspneic at rest and  was found to have bilateral pneumonia by chest x-ray so she was admitted for  further evaluation and treatment.   HOSPITAL COURSE:  Problem 1:  BILATERAL PNEUMONIA WITH ACUTE COPD  EXACERBATION:  The patient was admitted empirically put on Rocephin and  azithromycin.  Cultures were obtained.  She was empirically treated with  nebulized bronchodilator therapy, anti-tussives and an expectorant.  She was  not put on Solu-Medrol initially out of concern for the severity of her  pneumonia and possible immune suppression disease.  However, 24 hours after  admission, her respiratory status worsened and she was transferred to the  step-down unit and started on Solu-Medrol.  Her clinical course has been one  of gradual improvement over the next several days.  She continued  on empiric  azithromycin and Rocephin for a total course of five days.  Her antibiotic  therapy was then changed to Avelox which she tolerated well.  A white blood  cell count which reached a high of 24.2 continues to steadily decline.  At  this point, she feels stable for discharge and ready for home.  Her O2  saturation on room air with ambulation was only 86% and therefore she will  be started on home oxygen therapy.  She will be discharged on a prednisone  taper as well.  She will complete an additional four days of therapy with  Avelox.   Problem 2:  HYPERKALEMIA:  The patient was found to be hyperkalemic several  days into her admission when it was noticed that she had potassium in her IV  fluids and was also being supplemented with potassium.  This corrected  quickly with discontinuation of potassium supplementation.   Problem 3:  DEHYDRATION:  The patient responded good to IV fluid  rehydration.   Problem 4:  MILD ANEMIA:  The patient does have a mild anemia,  likely  secondary to chronic disease.  Further workup will be deferred to her  primary care physician as indicated.   Problem 5:  HYPERGLYCEMIA:  The patient had steroid-induced hyperglycemia  and is treated with sliding scale insulin as needed.  At this point, her  glycemic control was good and she will be rapidly tapered off of prednisone  so no further treatment is warranted at this time.   DISPOSITION:  Again, the patient is stable for discharge home.  [facility] will provide her oxygen.  She is independent and lives with her  family and will have home health PT, OT and a nurse.           ______________________________  Hillery Aldo, M.D.     CR/MEDQ  D:  10/03/2005  T:  10/05/2005  Job:  [id number]   cc:   Quita Skye. Artis Flock, M.D.  Fax: [phone]

## 2010-10-10 NOTE — H&P (Signed)
[place]  Patient:    Michelle Hopkins, Michelle Hopkins Visit Number: [id number] MRN: [id number]          Service Type: EMS Location: [facility] [place] Attending Physician:  Lorre Nick Dictated by:   Druscilla Brownie. Shela Nevin, P.A. Admit Date:  06/24/2001   CC:         Quita Skye. Artis Flock, M.D.   History and Physical  CHIEF COMPLAINT:  "Pain in my left hip."  HISTORY OF PRESENT ILLNESS:  This is a 77 year old white female who was at home this morning and had left her house to retrieve her paper. Unfortunately, she slipped on a patch of ice and fell to the left.  She had immediate pain in the left hip.  She was unable to stand.  She called her family, who then called an ambulance and she was brought to the [employer] [facility] Emergency Room for further evaluation and treatment.  X-rays here in the emergency room showed a displaced left femoral neck fracture. This lady is in relatively good health with her family physician being Quita Skye. Artis Flock, M.D.  After he was contacted to apprise him of the situation, he felt that she in good enough health to undergo surgical procedure.  She is being admitted for cemented bipolar hemiarthroplasty of the left hip.  PAST MEDICAL HISTORY:  This patient does have a history of mild emphysema. She recently was treated for bronchitis about two weeks ago and has no symptoms at the present time.  She occasionally has GERD with reflux-type symptoms and she takes Aciphex p.r.n.  ALLERGIES:  She is intolerant to CODEINE.  SOCIAL HISTORY:  The patient is married.  She is very active.  She neither smokes nor drinks.  FAMILY HISTORY:  Noncontributory.  REVIEW OF SYSTEMS:  CNS:  No seizure disorder, paralysis, numbness, or double vision.  Respiratory:  The patient has occasional shortness of breath with some exertion, however, she does very well with her day to day activities.  No hemoptysis.  Cardiovascular:  No chest pain, no angina,  or orthopnea. Gastrointestinal:  No nausea, vomiting, melena, or blood stool.  She does very well with her Aciphex as far as her reflux is concerned.  Genitourinary:  No discharge, dysuria, or hematuria.  Musculoskeletal:  Primarily as in the present illness with her pain in the left hip.  PHYSICAL EXAMINATION:  Alert, cooperative, friendly, and fully alert.  A thin, 77 year old, white female seen lying on the emergency room stretcher.  She is accompanied by her husband, daughter, son-in-law, and daughter-in-law.  VITAL SIGNS:  Blood pressure 148/91, pulse 77 and regular, respirations 20 and nonlabored, temperature 98.1 degrees.  HEENT:  Normocephalic.  No contusions or trauma seen.  PERRLA.  The oropharynx is clear.  CHEST:  Clear to auscultation.  No rhonchi and no rales (the patient is supine).  HEART:  Regular rate and rhythm.  No murmurs are heard.  ABDOMEN:  Soft and scaphoid.  Bowel sounds present.  GENITALIA:  Not done, not pertinent to present illness.  RECTAL:  Not done, not pertinent to present illness.  PELVIC:  Not done, not pertinent to present illness.  BREASTS:  Not done, not pertinent to present illness.  EXTREMITIES:  The left lower extremity is shortened and externally rotated. Neurovascular is intact to the foot.  She has a 2+ dorsalis pedis pulse.  ADMISSION DIAGNOSES: 1. Displaced left femoral neck fracture. 2. Mild gastroesophageal reflux disease. 3. History of emphysema/bronchitis.  PLAN:  The  patient will undergo a left hip cemented bipolar hemiarthroplasty. Should we have any medical problems while she is in the hospital, we will certainly contact Quita Skye. Kindl, M.D., to follow along with [place]. Dictated by:   Druscilla Brownie. Shela Nevin, P.A. Attending Physician:  Lorre Nick DD:  06/24/01 TD:  06/24/01 Job: [id number]

## 2011-05-26 DIAGNOSIS — J690 Pneumonitis due to inhalation of food and vomit: Secondary | ICD-10-CM

## 2011-05-26 DIAGNOSIS — B3781 Candidal esophagitis: Secondary | ICD-10-CM

## 2011-05-26 HISTORY — DX: Candidal esophagitis: B37.81

## 2011-05-26 HISTORY — DX: Pneumonitis due to inhalation of food and vomit: J69.0

## 2011-05-27 ENCOUNTER — Encounter (HOSPITAL_COMMUNITY): Payer: Self-pay | Admitting: Physician Assistant

## 2011-05-27 ENCOUNTER — Inpatient Hospital Stay (HOSPITAL_COMMUNITY)
Admission: AD | Admit: 2011-05-27 | Discharge: 2011-06-11 | DRG: 177 | Disposition: A | Discharge status: Skilled Nursing Facility | Payer: Medicare Other | Source: Ambulatory Visit | Attending: Internal Medicine | Admitting: Internal Medicine

## 2011-05-27 ENCOUNTER — Inpatient Hospital Stay (HOSPITAL_COMMUNITY): Payer: Medicare Other

## 2011-05-27 DIAGNOSIS — E039 Hypothyroidism, unspecified: Secondary | ICD-10-CM | POA: Insufficient documentation

## 2011-05-27 DIAGNOSIS — E46 Unspecified protein-calorie malnutrition: Secondary | ICD-10-CM | POA: Diagnosis present

## 2011-05-27 DIAGNOSIS — R0602 Shortness of breath: Secondary | ICD-10-CM | POA: Insufficient documentation

## 2011-05-27 DIAGNOSIS — J449 Chronic obstructive pulmonary disease, unspecified: Secondary | ICD-10-CM | POA: Diagnosis present

## 2011-05-27 DIAGNOSIS — K222 Esophageal obstruction: Secondary | ICD-10-CM | POA: Diagnosis present

## 2011-05-27 DIAGNOSIS — J4489 Other specified chronic obstructive pulmonary disease: Secondary | ICD-10-CM | POA: Diagnosis present

## 2011-05-27 DIAGNOSIS — R64 Cachexia: Secondary | ICD-10-CM | POA: Diagnosis present

## 2011-05-27 DIAGNOSIS — K209 Esophagitis, unspecified without bleeding: Secondary | ICD-10-CM | POA: Clinically undetermined

## 2011-05-27 DIAGNOSIS — I10 Essential (primary) hypertension: Secondary | ICD-10-CM | POA: Diagnosis present

## 2011-05-27 DIAGNOSIS — E559 Vitamin D deficiency, unspecified: Secondary | ICD-10-CM | POA: Diagnosis present

## 2011-05-27 DIAGNOSIS — B0229 Other postherpetic nervous system involvement: Secondary | ICD-10-CM | POA: Diagnosis present

## 2011-05-27 DIAGNOSIS — M810 Age-related osteoporosis without current pathological fracture: Secondary | ICD-10-CM | POA: Insufficient documentation

## 2011-05-27 DIAGNOSIS — Z87891 Personal history of nicotine dependence: Secondary | ICD-10-CM

## 2011-05-27 DIAGNOSIS — Z91040 Latex allergy status: Secondary | ICD-10-CM

## 2011-05-27 DIAGNOSIS — T18108A Unspecified foreign body in esophagus causing other injury, initial encounter: Secondary | ICD-10-CM | POA: Diagnosis present

## 2011-05-27 DIAGNOSIS — Z66 Do not resuscitate: Secondary | ICD-10-CM | POA: Diagnosis present

## 2011-05-27 DIAGNOSIS — I4891 Unspecified atrial fibrillation: Secondary | ICD-10-CM | POA: Insufficient documentation

## 2011-05-27 DIAGNOSIS — R634 Abnormal weight loss: Secondary | ICD-10-CM | POA: Insufficient documentation

## 2011-05-27 DIAGNOSIS — J189 Pneumonia, unspecified organism: Secondary | ICD-10-CM

## 2011-05-27 DIAGNOSIS — E876 Hypokalemia: Secondary | ICD-10-CM | POA: Diagnosis not present

## 2011-05-27 DIAGNOSIS — J9600 Acute respiratory failure, unspecified whether with hypoxia or hypercapnia: Secondary | ICD-10-CM | POA: Diagnosis present

## 2011-05-27 DIAGNOSIS — Z79899 Other long term (current) drug therapy: Secondary | ICD-10-CM

## 2011-05-27 DIAGNOSIS — R5381 Other malaise: Secondary | ICD-10-CM | POA: Diagnosis present

## 2011-05-27 DIAGNOSIS — R1310 Dysphagia, unspecified: Secondary | ICD-10-CM | POA: Diagnosis present

## 2011-05-27 DIAGNOSIS — J690 Pneumonitis due to inhalation of food and vomit: Principal | ICD-10-CM | POA: Diagnosis present

## 2011-05-27 DIAGNOSIS — B3781 Candidal esophagitis: Secondary | ICD-10-CM | POA: Diagnosis present

## 2011-05-27 DIAGNOSIS — Z888 Allergy status to other drugs, medicaments and biological substances status: Secondary | ICD-10-CM

## 2011-05-27 DIAGNOSIS — G4700 Insomnia, unspecified: Secondary | ICD-10-CM | POA: Diagnosis present

## 2011-05-27 DIAGNOSIS — K220 Achalasia of cardia: Secondary | ICD-10-CM | POA: Diagnosis present

## 2011-05-27 DIAGNOSIS — R0902 Hypoxemia: Secondary | ICD-10-CM | POA: Diagnosis present

## 2011-05-27 DIAGNOSIS — K219 Gastro-esophageal reflux disease without esophagitis: Secondary | ICD-10-CM | POA: Diagnosis present

## 2011-05-27 DIAGNOSIS — F3289 Other specified depressive episodes: Secondary | ICD-10-CM | POA: Diagnosis present

## 2011-05-27 DIAGNOSIS — F329 Major depressive disorder, single episode, unspecified: Secondary | ICD-10-CM | POA: Diagnosis present

## 2011-05-27 DIAGNOSIS — R933 Abnormal findings on diagnostic imaging of other parts of digestive tract: Secondary | ICD-10-CM

## 2011-05-27 DIAGNOSIS — L930 Discoid lupus erythematosus: Secondary | ICD-10-CM | POA: Diagnosis present

## 2011-05-27 DIAGNOSIS — I5033 Acute on chronic diastolic (congestive) heart failure: Secondary | ICD-10-CM

## 2011-05-27 HISTORY — DX: Essential (primary) hypertension: I10

## 2011-05-27 HISTORY — DX: Chronic obstructive pulmonary disease, unspecified: J44.9

## 2011-05-27 HISTORY — DX: Hypothyroidism, unspecified: E03.9

## 2011-05-27 HISTORY — DX: Other postherpetic nervous system involvement: B02.29

## 2011-05-27 LAB — CBC
HCT: 37.7 % (ref 36.0–46.0)
MCH: 28.6 pg (ref 26.0–34.0)
MCV: 85.7 fL (ref 78.0–100.0)
Platelets: 220 10*3/uL (ref 150–400)
RBC: 4.4 MIL/uL (ref 3.87–5.11)
RDW: 13.8 % (ref 11.5–15.5)
WBC: 18.3 10*3/uL — ABNORMAL HIGH (ref 4.0–10.5)

## 2011-05-27 LAB — COMPREHENSIVE METABOLIC PANEL
AST: 48 U/L — ABNORMAL HIGH (ref 0–37)
Alkaline Phosphatase: 214 U/L — ABNORMAL HIGH (ref 39–117)
BUN: 27 mg/dL — ABNORMAL HIGH (ref 6–23)
Calcium: 9.3 mg/dL (ref 8.4–10.5)
Potassium: 3.7 mEq/L (ref 3.5–5.1)

## 2011-05-27 LAB — BLOOD GAS, ARTERIAL
Acid-Base Excess: 0.6 mmol/L (ref 0.0–2.0)
Bicarbonate: 24.6 mEq/L — ABNORMAL HIGH (ref 20.0–24.0)
Patient temperature: 98.6
TCO2: 25.7 mmol/L (ref 0–100)
pCO2 arterial: 38.4 mmHg (ref 35.0–45.0)
pO2, Arterial: 291 mmHg — ABNORMAL HIGH (ref 80.0–100.0)

## 2011-05-27 LAB — PROTIME-INR
INR: 1.13 (ref 0.00–1.49)
Prothrombin Time: 14.7 seconds (ref 11.6–15.2)

## 2011-05-27 LAB — CK TOTAL AND CKMB (NOT AT ARMC)
CK, MB: 12 ng/mL (ref 0.3–4.0)
Relative Index: 6.7 — ABNORMAL HIGH (ref 0.0–2.5)

## 2011-05-27 LAB — TSH: TSH: 2.013 u[IU]/mL (ref 0.350–4.500)

## 2011-05-27 LAB — GLUCOSE, CAPILLARY: Glucose-Capillary: 140 mg/dL — ABNORMAL HIGH (ref 70–99)

## 2011-05-27 LAB — TROPONIN I: Troponin I: <0.3 ng/mL (ref <0.30)

## 2011-05-27 MED ORDER — TIOTROPIUM BROMIDE MONOHYDRATE 18 MCG IN CAPS
18.0000 ug | ORAL_CAPSULE | Freq: Every day | RESPIRATORY_TRACT | Status: DC
  Administered 2011-05-28 – 2011-06-11 (×14): 18 ug via RESPIRATORY_TRACT
  Filled 2011-05-27 (×4): qty 5

## 2011-05-27 MED ORDER — MIRTAZAPINE 15 MG PO TBDP
15.0000 mg | ORAL_TABLET | Freq: Every day | ORAL | Status: DC
  Administered 2011-05-27 – 2011-06-10 (×10): 15 mg via ORAL
  Filled 2011-05-27 (×19): qty 1

## 2011-05-27 MED ORDER — IOHEXOL 300 MG/ML  SOLN
80.0000 mL | Freq: Once | INTRAMUSCULAR | Status: AC | PRN
  Administered 2011-05-27: 80 mL via INTRAVENOUS

## 2011-05-27 MED ORDER — LEVOTHYROXINE SODIUM 88 MCG PO TABS: 88.0000 ug | ORAL_TABLET | Freq: Every day | ORAL | Status: DC

## 2011-05-27 MED ORDER — DILTIAZEM LOAD VIA INFUSION
10.0000 mg | Freq: Once | INTRAVENOUS | Status: AC
  Administered 2011-05-27: 10 mg via INTRAVENOUS
  Filled 2011-05-27: qty 10

## 2011-05-27 MED ORDER — PANTOPRAZOLE SODIUM 40 MG PO TBEC
40.0000 mg | DELAYED_RELEASE_TABLET | Freq: Every day | ORAL | Status: DC
  Administered 2011-05-28 – 2011-06-02 (×6): 40 mg via ORAL
  Filled 2011-05-27 (×6): qty 1

## 2011-05-27 MED ORDER — DILTIAZEM HCL 100 MG IV SOLR
5.0000 mg/h | INTRAVENOUS | Status: DC
  Administered 2011-05-27 – 2011-05-28 (×2): 5 mg/h via INTRAVENOUS
  Filled 2011-05-27: qty 100

## 2011-05-27 MED ORDER — SODIUM CHLORIDE 0.9 % IV SOLN
INTRAVENOUS | Status: DC
  Administered 2011-05-27 – 2011-05-30 (×4): via INTRAVENOUS
  Administered 2011-05-31: 10 mL via INTRAVENOUS
  Administered 2011-06-01 – 2011-06-05 (×3): via INTRAVENOUS

## 2011-05-27 MED ORDER — LEVOTHYROXINE SODIUM 88 MCG PO TABS
88.0000 ug | ORAL_TABLET | Freq: Every day | ORAL | Status: DC
  Administered 2011-05-28 – 2011-06-03 (×7): 88 ug via ORAL
  Filled 2011-05-27 (×11): qty 1

## 2011-05-27 MED ORDER — TIOTROPIUM BROMIDE MONOHYDRATE 18 MCG IN CAPS: 18.0000 ug | ORAL_CAPSULE | Freq: Every day | RESPIRATORY_TRACT | Status: DC

## 2011-05-27 MED ORDER — DILTIAZEM LOAD VIA INFUSION: 10.0000 mg | Freq: Once | INTRAVENOUS | Status: DC

## 2011-05-27 MED ORDER — SODIUM CHLORIDE 0.9 % IV SOLN
500.0000 mg | Freq: Three times a day (TID) | INTRAVENOUS | Status: DC
  Administered 2011-05-27 – 2011-05-28 (×2): 500 mg via INTRAVENOUS
  Filled 2011-05-27 (×6): qty 500

## 2011-05-27 MED ORDER — GABAPENTIN 300 MG PO CAPS
300.0000 mg | ORAL_CAPSULE | Freq: Three times a day (TID) | ORAL | Status: DC
  Administered 2011-05-27 – 2011-06-02 (×19): 300 mg via ORAL
  Filled 2011-05-27 (×32): qty 1

## 2011-05-27 MED ORDER — MIRTAZAPINE 15 MG PO TABS: 15.0000 mg | ORAL_TABLET | Freq: Every day | ORAL | Status: DC

## 2011-05-27 MED ORDER — GABAPENTIN 300 MG PO CAPS: 300.0000 mg | ORAL_CAPSULE | Freq: Three times a day (TID) | ORAL | Status: DC

## 2011-05-27 MED ORDER — FLUTICASONE-SALMETEROL 250-50 MCG/DOSE IN AEPB
1.0000 | INHALATION_SPRAY | Freq: Two times a day (BID) | RESPIRATORY_TRACT | Status: DC
  Administered 2011-05-28 – 2011-06-11 (×23): 1 via RESPIRATORY_TRACT
  Filled 2011-05-27 (×6): qty 14

## 2011-05-27 MED ORDER — FLUTICASONE-SALMETEROL 250-50 MCG/DOSE IN AEPB: 1.0000 | INHALATION_SPRAY | Freq: Every day | RESPIRATORY_TRACT | Status: DC

## 2011-05-27 NOTE — Consult Note (Signed)
Consult Note   Patient ID: Michelle Hopkins MRN: [id number], DOB/AGE: August 13, 1926   Admit date: 05/27/2011 Date of Consult: 05/27/2011   Primary Physician: Gaspar Garbe, MD, MD Primary Cardiologist: New  Pt. Profile: Ms. Wisby is a 78 yo Caucasian female with PMHx significant for HTN, SVT (s/p ablation approx 8 years ago by Dr. Graciela Husbands, recent suspected TIA (per relatives, suspected on recent eye exam), abnormal CXR (performed in the office today revealed bilateral lung masses), COPD (20 pack-year history) who was transferred from her Dr. Rinaldo Cloud office today after EKG revealed atrial fibrillation with RVR and she was found to be hypoxemic at 71-85% on RA.   Problem List: No past medical history on file.  No past surgical history on file.   Allergies: Allergies not on file  HPI:   HPI was supplemented by relatives in the room. Pt reports an appoximately 1 week history of fatigue and weakness. She states that she lives by herself and is very independent, however over the past week, has felt very tired. She endorses chronic productive cough consisting of thick, white sputum. She denies chest pain, palpitations, diaphoresis, lightheadedness, dizziness, SOB, orthopnea, PND, LE edema, extended travel or exposure to sick contacts. She denies outpatient rate-control agents or anticoagulation. She states she has not had rhythm   She has recently undergone dental procedure approximately 1 month ago. She reports daily caffeine consumption at 2-3 cups of coffee a day. Denies alcohol use, elicit drug use and herbal supplementation.   She was advised to go to her doctor's office. While there, EKG was ordered revealing atrial fibrillation, HR 140s. CXR was ordered at the office revealing large right sided irregular densities in upper and lower lobes, density on the left lobe. CT scan was recommended for follow-up.   Today, she was placed on NRB and 4L n/c. ABG revealed pH 7.42, pCO2 38. NRB was  discontinued. She was continued on n/c. Cardizem bolus + infusion started. HR decreased to 108. IVF started. Anticoagulation held due to suspicion of lung masses.    Inpatient Medications:     . diltiazem  10 mg Intravenous Once  . DISCONTD: diltiazem  10 mg Intravenous Once   No prescriptions prior to admission    No family history on file.   History   Social History  . Marital Status: Widowed    Spouse Name: N/A    Number of Children: N/A  . Years of Education: N/A   Occupational History  . Not on file.   Social History Main Topics  . Smoking status: Not on file  . Smokeless tobacco: Not on file  . Alcohol Use: Not on file  . Drug Use: Not on file  . Sexually Active: Not on file   Other Topics Concern  . Not on file   Social History Narrative  . No narrative on file     Review of Systems: General: negative for chills, fever, night sweats. Positive for weight changes 20-30 lbs, weakness, fatigue Cardiovascular: negative for chest pain, dyspnea on exertion, edema, orthopnea, palpitations, paroxysmal nocturnal dyspnea or shortness of breath Dermatological: negative for rash Respiratory: positive for productive cough, negative for wheezing Urologic: negative for hematuria Abdominal: negative for nausea, vomiting, diarrhea, bright red blood per rectum, melena, or hematemesis Neurologic: negative for syncope and dizziness All other systems reviewed and are otherwise negative except as noted above.  Physical Exam: Blood pressure 122/85, pulse 126, temperature 98.1 F (36.7 C), resp. rate 24, height 5\' 1"  (1.549 m),  SpO2 100.00%.  General: Cachectic appearing, NAD Head: Normocephalic, atraumatic, sclera non-icteric, no xanthomas, nares are without discharge. Neck: Negative for carotid bruits. JVD not elevated. Lungs: Clear bilaterally to auscultation without wheezes, rales, or rhonchi. Breathing is unlabored. Heart: RRR with S1 S2. No murmurs, rubs, or gallops  appreciated. Abdomen: Soft, non-tender, non-distended with normoactive bowel sounds. No hepatomegaly. No rebound/guarding. No obvious abdominal masses. Msk:  Strength and tone appears normal for age. Extremities: No clubbing, cyanosis or edema.  Distal pedal pulses are 2+ and equal bilaterally. Neuro: Alert and oriented X 3. Moves all extremities spontaneously. Psych:  Responds to questions appropriately with a normal affect.  Labs: No results found for this basename: WBC:2,HGB:2,HCT:2,MCV:2,PLT:2 in the last 72 hours No results found for this basename: VITAMINB12,FOLATE,FERRITIN,TIBC,IRON,RETICCTPCT in the last 72 hours No results found for this basename: DDIMER:2 in the last 72 hours No results found for this basename: NA:3,K:3,CL:3,CO2:3,BUN:3,CREATININE:3,CALCIUM:3,LABALBU,PROT,BILITOT,ALKPHOS,ALT,AST,AMYLASE,LIPASE,GLUCOSE:3 in the last 168 hours No results found for this basename: HGBA1C in the last 72 hours No results found for this basename: CKTOTAL:3,CKMB:3,CKMBINDEX:3,TROPONINI:3 in the last 72 hours No components found with this basename: POCBNP No results found for this basename: CHOL,HDL,LDLCALC,TRIG,CHOLHDL,LDLDIRECT in the last 72 hours No results found for this basename: TSH,T4TOTAL,FREET3,T3FREE,THYROIDAB in the last 72 hours  Radiology/Studies: No results found.  EKG: Pending  ASSESSMENT AND PLAN:   1. Atrial fibrillation with RVR- new onset; evidenced by EKG in the office and on telemetry pt has underlying COPD and there is concern for malignant lung process attributing to recent weight loss and multiple lung masses on CXR. Additional exacerbating factors include caffeine use, possible recent viral illness.   - Initiated Cardizem bolus + infusion brought rate down to low 100s. Will continue.  - Pt with hypothyroidism, will check TSH  - Will hold on formal anticoagulation due to concern for pulmonary malignancy; pt apparently had visual field deficits on recent  ophthalmology exam concerning for TIA; will defer until formal lung work-up completed  2. Abnormal chest x-ray- outlined above  - Will order chest CT     Signed, R. Hurman Horn, PA-C 05/27/2011, 5:11 PM   The patient presents with atrial fibrillation with a rapid ventricular response. This occurs in the setting of a prior ophthalmologic old issues suggestive of a prior stroke diagnosed in November this year. She reportedly also has a very abnormal chest x-ray concerning to Dr. Evlyn Kanner for cancer. In light of that, the concern is whether her ophthalmologically she may be central. As such, we will plan to hold anticoagulation until which time the pulmonary issue and the possible intracranial issue are resolved.  Rate control will be necessary; diltiazem is being used.  Because of contributing issues will need to be explored including left ventricular function and thyroid status. Will obtain an echo.

## 2011-05-27 NOTE — Progress Notes (Signed)
CRITICAL VALUE ALERT  Critical value received:  CKMB 12  Date of notification:  05/27/11  Time of notification:  1903  Critical value read back:yes  Nurse who received alert:  Roselie Awkward RN  MD notified (1st page):  Dr Evlyn Kanner (internal med)  Time of first page:  1905  MD notified (2nd page): Hurman Horn PA (Cardiology)  Time of second page:1910  Responding MD: Dr Evlyn Kanner and Hurman Horn PA Time MD responded:  [phone]

## 2011-05-27 NOTE — Significant Event (Signed)
Rapid Response Event Note  Overview: Time Called: 1550 Arrival Time: 1600 Event Type: Cardiac;Respiratory  Initial Focused Assessment: Patient alert sitting in bed - on NRB mask at 15 liters - pale skin w/d.  Able to speak without problems - chronic jerking arms head normal for patient per herself and her family report.  Patient denies SOB, no pain, no n/v - reports no fever. Bil BS present - decreased on right side - no wheezing noted.  Resps 20 - 24 - NAD noted.  Coughing up thick sticky white sputum.  Abd soft - non-tender.  No pedal edema noted.  Denies pain in calves.  Reports gradual weakness and less activity tolerance over last 2 weeks.  Monitor show RAF with RVR.    Manual B/P done - WNL.  Difficult to get O2 saturation - patient with jerking movement and RAF makes difficult pleth - reading from 81-98% with no change to O2 flow.  Poor skin turgor - patient very thin.  Very sticky dry oral mucosa.   Interventions: Inserted # 22 PIV left arm with second attempt.  To NSL.  Amber, RN speaking with Dr. Evlyn Kanner.  Spoke with family - hx last few weeks.  Chest xray from Dr. Rinaldo Cloud office reviewed.  ABG drawn- 7.42 [phone] Bicarb Alinda Money PA with [facility] Cards here - reviewed ABG - patient changed to 4 L n/c.  Patient remains without distress - speaking well and asking for coffee.  1655: Cardizem 10 mg IV bolus given per order - B/P 92/50 post bolus  - HR 108.  NS started at 50 cc/hr.  1700:  B/P 102/58 HR 106 RR 22 O2 sat 98% on 4 L n/c.  1715;  110/62 HR 98 Afib RR 22 O2 sat 97%.  Patient continues to deny pain, SOB.  Care returned to [facility] [place] - handoff report.  Call if needed. Patient to transfer to SDU.   Event Summary: Name of Physician Notified: Dr. Evlyn Kanner at [phone]  Name of Consulting Physician Notified: Dr. Graciela Husbands at  (pta RRT - Dr. Evlyn Kanner consulted them)  Outcome: Transferred (Comment)  Event End Time: 1730  [place]

## 2011-05-27 NOTE — H&P (Signed)
PCP:   Gaspar Garbe, MD, MD   Chief Complaint:  Shortness and breath and weakness  HPI: This is an 78 year old white female presented as a work in to the office today. Her primary care physician is Dr. Wylene Simmer. The patient relates at least 2-3 weeks of malaise anorexia and trouble breathing. She hasn't eaten much all the last week 2. She has been taking fluids well. Her daughter found her to be significantly short of breath. She has not been coughing. She's had no hemoptysis. She notes she's lost weight we didn't weigh her today so not sure how far down she's gone. She has no pleuritic chest pain. Her bowels been working fairly well. She's had no heat or cold intolerance. Her vision has been good. He's had no sore throat or dysphagia. She's had no peripheral edema. She's had no urinary symptoms. Her muscles and joints have not been giving any difficulty. She's had no depression anxiety. Chest and: Daryl Eastern had no bruising or bleeding. She's had no fevers or chills or sweats. In the office she was found to have a markedly abnormal chest x-ray with bilateral masses more on the right than left. These are new findings. She was also found to be in rapid atrial fibrillation at a rate of about 140. She was also hypoxic. Basin all of this she is brought to hospital for admission and treatment. Thus far she's been seen by Dr. Graciela Husbands and is on a Cardizem drip with some improvement.  Review of Systems:  Review of Systems - see above Past Medical History: Past Medical History  Diagnosis Date  . COPD (chronic obstructive pulmonary disease)   . HTN (hypertension)   . Hypothyroidism   . Osteoporosis   . Postherpetic neuralgia   Gastroesophageal reflux Discoid lupus Shingles with postherpetic neuralgia Bilateral pneumonia 2007 Prior SVT with cardiac ablation Past Surgical History  Procedure Date  . Appendectomy 1996  . Left hip repair s/p fracture 2004    Medications: Prior to Admission  medications   Medication Sig Start Date End Date Taking? Authorizing Provider  beta carotene w/minerals (OCUVITE) tablet Take 1 tablet by mouth daily.     Yes Historical Provider, MD  cefdinir (OMNICEF) 300 MG capsule Take 300 mg by mouth 2 (two) times daily as needed. For shingles    Yes Historical Provider, MD  Fluticasone-Salmeterol (ADVAIR) 250-50 MCG/DOSE AEPB Inhale 1 puff into the lungs daily.     Yes Historical Provider, MD  gabapentin (NEURONTIN) 300 MG capsule Take 300 mg by mouth daily as needed. For nerve pain from shingles    Yes Historical Provider, MD  levothyroxine (SYNTHROID, LEVOTHROID) 88 MCG tablet Take 88 mcg by mouth daily.     Yes Historical Provider, MD  mirtazapine (REMERON) 15 MG tablet Take 15 mg by mouth at bedtime.     Yes Historical Provider, MD  omeprazole (PRILOSEC) 20 MG capsule Take 20 mg by mouth daily.     Yes Historical Provider, MD  tiotropium (SPIRIVA) 18 MCG inhalation capsule Place 18 mcg into inhaler and inhale daily.     Yes Historical Provider, MD    Allergies:   Allergies  Allergen Reactions  . Avelox (Moxifloxacin Hcl In Nacl)   . Canarium     Unknown reaction  . Codeine   . Latex     Rash   . Neosporin (Neomycin-Polymyxin B)   . Rice   . Tape     Rips skin    Social History:  reports  that she has quit smoking. Her smoking use included Cigarettes. She has a 20 pack-year smoking history. She does not have any smokeless tobacco history on file. She reports that she does not drink alcohol or use illicit drugs.  Family History: Family History  Problem Relation Age of Onset  . Colon cancer Father  died at 61   . Huntington's disease Mother  died at 18   . Coronary artery disease Brother   . Melanoma Sister     Physical Exam: Filed Vitals:   05/27/11 1551 05/27/11 1559 05/27/11 1610 05/27/11 1700  BP: 122/85  122/68   Pulse: 126  142   Temp: 98.1 F (36.7 C)  97.7 F (36.5 C)   TempSrc:   Oral   Resp: 24  24   Height: 5\' 1"   (1.549 m)     Weight:    40.778 kg (89 lb 14.4 oz)  SpO2: 68% 100%     General appearance: cachectic thin white female bitemporal wasting. Head: Normocephalic, without obvious abnormality, atraumatic Eyes: There's no lid lag or exophthalmos.  . Throat oral mucous membranes are moist. Neck: no adenopathy, no carotid bruit, no JVD and thyroid not enlarged, symmetric, no tenderness/mass/nodules Resp: diminished breath sounds bilaterally, no wheezing is present. Increased work of breathing is present. Cardio: irregularly irregular rhythm, fast with systolic murmur. GI: Scaphoid soft nontender. The liver edge seems to be enlarged. Cannot feel a spleen. No masses otherwise are felt. There are no pulsations. Extremities: extremities normal, atraumatic, no cyanosis or edema Pulses: Slightly diminished no edema Lymph nodes: Cervical adenopathy: no cervical lymphadenopathy. No axillary adenopathy no inguinal adenopathy Neurologic: Weak and a bit tremulous Alert and oriented X 3, she is in a wheelchair. She is unsteady when standing Skin exam: She has a few bruises no suspicious lesions are present.   Labs on Admission:  No results found for this basename: NA:2,K:2,CL:2,CO2:2,GLUCOSE:2,BUN:2,CREATININE:2,CALCIUM:2,MG:2,PHOS:2 in the last 72 hours No results found for this basename: AST:2,ALT:2,ALKPHOS:2,BILITOT:2,PROT:2,ALBUMIN:2 in the last 72 hours No results found for this basename: LIPASE:2,AMYLASE:2 in the last 72 hours  Basename 05/27/11 1735  WBC 18.3*  NEUTROABS --  HGB 12.6  HCT 37.7  MCV 85.7  PLT 220   No results found for this basename: CKTOTAL:3,CKMB:3,CKMBINDEX:3,TROPONINI:3 in the last 72 hours No results found for this basename: INR, PROTIME   No results found for this basename: TSH,T4TOTAL,FREET3,T3FREE,THYROIDAB in the last 72 hours No results found for this basename: VITAMINB12:2,FOLATE:2,FERRITIN:2,TIBC:2,IRON:2,RETICCTPCT:2 in the last 72 hours  Radiological Exams on  Admission: No results found. No orders found for this or any previous visit.  Assessment/Plan Principal Problem:  *Abnormal CXR with multiple nodules: With a radiographic appearance along with the weight loss and anorexia quite worried that this is a malignant process. Of course inflammatory infectious possibilities also exist. We will check a CT of the chest tonight. This should help delineate the problem best. X-ray previously did not show these changes. The hepatomegaly is a bit worrisome. Liver testing is pending. Patient Active Problem List  Diagnoses  . Rapid atrial fibrillation already seen by cardiology. Doing better on the Cardizem drip. Enzymes will be cycled. An echo is planned. I am a bit worried that pericardial metastases could be present   .    Marland Kitchen Weight loss: This is worrisome for malignant process but could be infectious or inflammatory as well   . Unspecified hypothyroidism: TSH rechecked to make sure not driving her atrial fibrillation   . Osteoporosis, unspecified, no new data   .  Essential hypertension, benign, stable without chronic medications   . Chronic airway obstruction, not elsewhere classified: The old x-ray looked like at least moderate emphysema. We'll continue her medications. Of specific note her oxygen saturation by oximetry seem to be inaccurate. A blood gas shows the saturation to be okay   . Herpes zoster with other nervous system complications: Continue Neurontin   . Unspecified vitamin D deficiency: We may need to check this   . Shortness of breath: This is multifactorial  Leukocytosis: This may be stress related, but LAD IV antibiotics just in case this is an infectious process  Hepatomegaly: We should see this well and the CT       Marcellis Frampton ALAN 05/27/2011, 6:18 PM his ring

## 2011-05-27 NOTE — Progress Notes (Signed)
Pt arrived from office.  O2 sats in the 80's on room air. O2 applied at 2L via [place].  Spoke with Dr. Rinaldo Cloud nurse, Asher Muir, in regards to pt's arrival, oxygen level, and the need for orders. Stated she would inform Dr. Evlyn Kanner. Awaiting orders. Hospital doctor, RN (pts nurse) made aware.

## 2011-05-28 DIAGNOSIS — I4891 Unspecified atrial fibrillation: Secondary | ICD-10-CM

## 2011-05-28 DIAGNOSIS — I369 Nonrheumatic tricuspid valve disorder, unspecified: Secondary | ICD-10-CM

## 2011-05-28 LAB — CBC
HCT: 34.9 % — ABNORMAL LOW (ref 36.0–46.0)
MCH: 28.1 pg (ref 26.0–34.0)
MCV: 86.8 fL (ref 78.0–100.0)
Platelets: 203 10*3/uL (ref 150–400)
RDW: 14 % (ref 11.5–15.5)
WBC: 12.8 10*3/uL — ABNORMAL HIGH (ref 4.0–10.5)

## 2011-05-28 LAB — BASIC METABOLIC PANEL
BUN: 22 mg/dL (ref 6–23)
CO2: 29 mEq/L (ref 19–32)
Calcium: 8.6 mg/dL (ref 8.4–10.5)
Chloride: 103 mEq/L (ref 96–112)
Creatinine, Ser: 0.6 mg/dL (ref 0.50–1.10)

## 2011-05-28 MED ORDER — DILTIAZEM HCL 30 MG PO TABS
30.0000 mg | ORAL_TABLET | Freq: Four times a day (QID) | ORAL | Status: DC
  Administered 2011-05-28 – 2011-05-29 (×4): 30 mg via ORAL
  Filled 2011-05-28 (×9): qty 1

## 2011-05-28 MED ORDER — ENOXAPARIN SODIUM 40 MG/0.4ML ~~LOC~~ SOLN
40.0000 mg | Freq: Two times a day (BID) | SUBCUTANEOUS | Status: DC
  Administered 2011-05-28 – 2011-06-01 (×10): 40 mg via SUBCUTANEOUS
  Filled 2011-05-28 (×16): qty 0.4

## 2011-05-28 MED ORDER — HEPARIN SOD (PORCINE) IN D5W 100 UNIT/ML IV SOLN
600.0000 [IU]/h | INTRAVENOUS | Status: DC
  Administered 2011-05-28: 600 [IU]/h via INTRAVENOUS
  Filled 2011-05-28: qty 250

## 2011-05-28 MED ORDER — ENSURE CLINICAL ST REVIGOR PO LIQD
237.0000 mL | Freq: Two times a day (BID) | ORAL | Status: DC
  Administered 2011-05-28 – 2011-06-02 (×9): 237 mL via ORAL
  Filled 2011-05-28 (×4): qty 237

## 2011-05-28 MED ORDER — SODIUM CHLORIDE 0.9 % IV SOLN
500.0000 mg | Freq: Two times a day (BID) | INTRAVENOUS | Status: DC
  Administered 2011-05-28 – 2011-05-30 (×4): 500 mg via INTRAVENOUS
  Filled 2011-05-28 (×6): qty 500

## 2011-05-28 NOTE — Progress Notes (Signed)
ANTICOAGULATION CONSULT NOTE - Follow Up Consult  Pharmacy Consult for Heparin to Lovenox Indication: atrial fibrillation  Allergies  Allergen Reactions  . Avelox (Moxifloxacin Hcl In Nacl)   . Canarium     Unknown reaction  . Codeine   . Latex     Rash   . Neosporin (Neomycin-Polymyxin B)   . Rice   . Tape     Rips skin    Patient Measurements: Height: 5\' 1"  (154.9 cm) Weight: 89 lb 14.4 oz (40.778 kg) IBW/kg (Calculated) : 47.8    Vital Signs: Temp: 98.5 F (36.9 C) (01/03 0800) Temp src: Oral (01/03 0800) BP: 120/71 mmHg (01/03 0800) Pulse Rate: 85  (01/03 0800)  Labs:  Basename 05/28/11 0330 05/27/11 1735  HGB 11.3* 12.6  HCT 34.9* 37.7  PLT 203 220  APTT -- --  LABPROT -- 14.7  INR -- 1.13  HEPARINUNFRC -- --  CREATININE 0.60 0.60  CKTOTAL -- 178*  CKMB -- 12.0*  TROPONINI -- <0.30   Estimated Creatinine Clearance: 34.9 ml/min (by C-G formula based on Cr of 0.6).   Medications:  Scheduled:    . diltiazem  10 mg Intravenous Once  . diltiazem  30 mg Oral Q6H  . Fluticasone-Salmeterol  1 puff Inhalation BID  . gabapentin  300 mg Oral TID  . imipenem-cilastatin  500 mg Intravenous Q8H  . levothyroxine  88 mcg Oral QAC breakfast  . mirtazapine  15 mg Oral QHS  . pantoprazole  40 mg Oral Q1200  . tiotropium  18 mcg Inhalation Daily  . DISCONTD: diltiazem  10 mg Intravenous Once  . DISCONTD: Fluticasone-Salmeterol  1 puff Inhalation Daily  . DISCONTD: gabapentin  300 mg Oral TID  . DISCONTD: levothyroxine  88 mcg Oral Daily  . DISCONTD: mirtazapine  15 mg Oral QHS  . DISCONTD: tiotropium  18 mcg Inhalation Daily    Assessment: 78 year old to switch from heparin to treatment lovenox for atrial fibrillation.  Goal of Therapy:  Appropriate dosing   Plan:  1) Lovenox 40 mg sq Q 12 hours  2) Follow up CBC, long term plan  Thank you  Elwin Sleight 05/28/2011,8:36 AM

## 2011-05-28 NOTE — Progress Notes (Signed)
  Echocardiogram 2D Echocardiogram has been performed.  Laurieanne Galloway Nira Retort 05/28/2011, 11:33 AM

## 2011-05-28 NOTE — Progress Notes (Signed)
Subjective: Close to her baseline today.  Breathing well since her HR is controlled, minimal Diltiazem needed.  Still in Afib.  Sats much better and no laboring.  Discussed her CT results with herself and her daughter.  Fortunately, no malignancy.  Objective: Vital signs in last 24 hours: Temp:  [97.7 F (36.5 C)-98.6 F (37 C)] 98.5 F (36.9 C) (01/03 0416) Pulse Rate:  [71-142] 71  (01/03 0715) Resp:  [24-31] 27  (01/03 0715) BP: (101-122)/(62-85) 120/71 mmHg (01/03 0715) SpO2:  [68 %-100 %] 92 % (01/03 0715) FiO2 (%):  [100 %] 100 % (01/02 1559) Weight:  [89 lb 14.4 oz (40.778 kg)] 89 lb 14.4 oz (40.778 kg) (01/02 1700) Weight change:     Intake/Output from previous day: 01/02 0701 - 01/03 0700 In: 962 [I.V.:762; IV Piggyback:200] Out: 275 [Urine:275] Intake/Output this shift:    General appearance: alert, cooperative, appears stated age and no distress Resp: diminished breath sounds bibasilar Chest wall: no tenderness Cardio: irregularly irregular rhythm GI: soft, non-tender; bowel sounds normal; no masses,  no organomegaly Extremities: extremities normal, atraumatic, no cyanosis or edema Skin: Mild medial R forearm bruise.  Lab Results:  Basename 05/28/11 0330 05/27/11 1735  WBC 12.8* 18.3*  HGB 11.3* 12.6  HCT 34.9* 37.7  PLT 203 220   BMET  Basename 05/28/11 0330 05/27/11 1735  NA 136 135  K 4.1 3.7  CL 103 97  CO2 29 27  GLUCOSE 113* 128*  BUN 22 27*  CREATININE 0.60 0.60  CALCIUM 8.6 9.3    Studies/Results: Ct Head Wo Contrast  05/27/2011  *RADIOLOGY REPORT*  Clinical Data: Optic nerves swelling.  Visual loss.  Abnormal chest x-ray.  CT HEAD WITHOUT CONTRAST  Technique:  Contiguous axial images were obtained from the base of the skull through the vertex without contrast.  Comparison: None.  Findings: There is diffuse cerebral atrophy.  Ventricular dilatation likely representing central atrophy.  No mass effect or midline shift.  No abnormal extra-axial  fluid collections.  Wallace Cullens- white matter junctions are distinct.  Basal cisterns are not effaced.  No evidence of acute intracranial hemorrhage.  Vascular calcifications.  Opacification of the right frontal sinus. Paranasal sinuses are otherwise not opacified.  No depressed skull fractures.  The globes appear intact with irregular contours.  No infiltration in the retrobulbar fat.  IMPRESSION: Cerebral atrophy.  No evidence of acute intracranial hemorrhage, mass lesion, or acute infarct.  Nothing to suggest increased intracranial pressure. Opacification of the right frontal sinus.  Original Report Authenticated By: Marlon Pel, M.D.   Ct Chest W Contrast  05/27/2011  *RADIOLOGY REPORT*  Clinical Data: Abnormal chest x-ray.  Hypoxemia with weight loss.  CT CHEST WITH CONTRAST  Technique:  Multidetector CT imaging of the chest was performed following the standard protocol during bolus administration of intravenous contrast.  Contrast: 80mL OMNIPAQUE IOHEXOL 300 MG/ML IV SOLN  Comparison: Chest radiographs 10/12/2005 and 09/30/2006.  No recent studies available.  Findings: The patient appears cachectic.  Small mediastinal and hilar lymph nodes are not pathologically enlarged.  There are is mild diffuse aortic atherosclerosis.  The pulmonary arteries appear patent.  There are small left and trace right pleural effusions.  There is no significant pericardial effusion.  There are dependent airspace opacities in both lower lobes with consolidation and air bronchograms.  In addition, there are patchy opacities within the lingula and right middle lobe.  Underlying emphysematous changes are present.  No endobronchial lesion is identified.  The visualized upper  abdomen is unremarkable.  There is no evidence of adrenal mass.  There is a mild scoliosis.  IMPRESSION:  1.  Multifocal air space opacities in both lungs most consistent with multilobar pneumonia. 2.  Small left greater than right pleural effusions. 3.  No  dominant mass, endobronchial lesion or significant lymphadenopathy.  Radiographic followup is recommended to exclude an atypical neoplastic process.  Original Report Authenticated By: Gerrianne Scale, M.D.    Medications:  I have reviewed the patient's current medications. Scheduled:   . diltiazem  10 mg Intravenous Once  . diltiazem  30 mg Oral Q6H  . Fluticasone-Salmeterol  1 puff Inhalation BID  . gabapentin  300 mg Oral TID  . imipenem-cilastatin  500 mg Intravenous Q8H  . levothyroxine  88 mcg Oral QAC breakfast  . mirtazapine  15 mg Oral QHS  . pantoprazole  40 mg Oral Q1200  . tiotropium  18 mcg Inhalation Daily  . DISCONTD: diltiazem  10 mg Intravenous Once  . DISCONTD: Fluticasone-Salmeterol  1 puff Inhalation Daily  . DISCONTD: gabapentin  300 mg Oral TID  . DISCONTD: levothyroxine  88 mcg Oral Daily  . DISCONTD: mirtazapine  15 mg Oral QHS  . DISCONTD: tiotropium  18 mcg Inhalation Daily   Continuous:   . sodium chloride 50 mL/hr at 05/27/11 1655  . heparin 6 mL/hr (05/28/11 0600)  . DISCONTD: diltiazem (CARDIZEM) infusion 5 mg/hr (05/28/11 0600)   ZOX:[id number]  Assessment/Plan: 1)  Multilobar Pneumonia.  Continue Primaxin.  The CT chest did not show indication of malignancy. 2)  Afib.  TSH still pending, but most likely related to her respiratory illness.  Cards following with an Echo pending.  Will convert over to oral Cardizem and subq Lovenox for ease of administration.  Given tendency for balance issues, a bit concerned about the idea of long term Coumadin in her. 3) Osteoporosis:  As above. 4)  Weight loss.  Daughter has primarily noted decreased appetite in the past couple of weeks due to her illness.  Might be a manifestation of her having the PNA. 5)  HTN  Controlled, Cardizem as well. 6)  WBC count coming down 7)  Herpetic neuralgia:  Continue Neurontin. 8)  Will transfer to tele floor given her sats are better and HR controlled.  PT/OT consults as well as  social work to see for home health needs.  Daughter only out of the house a couple of hours a day.  Has good home support.      LOS: 1 day   Adair Lauderback W 05/28/2011, 8:11 AM

## 2011-05-28 NOTE — Progress Notes (Signed)
ANTICOAGULATION CONSULT NOTE - Initial Consult  Pharmacy Consult for heparin   Indication: atrial fibrillation  Allergies  Allergen Reactions  . Avelox (Moxifloxacin Hcl In Nacl)   . Canarium     Unknown reaction  . Codeine   . Latex     Rash   . Neosporin (Neomycin-Polymyxin B)   . Rice   . Tape     Rips skin    Patient Measurements: Height: 5\' 1"  (154.9 cm) Weight: 89 lb 14.4 oz (40.778 kg) IBW/kg (Calculated) : 47.8  Adjusted Body Weight:   Vital Signs: Temp: 98.6 F (37 C) (01/02 2342) Temp src: Oral (01/02 2342) BP: 101/62 mmHg (01/02 2342) Pulse Rate: 99  (01/02 2342)  Labs:  Basename 05/27/11 1735  HGB 12.6  HCT 37.7  PLT 220  APTT --  LABPROT 14.7  INR 1.13  HEPARINUNFRC --  CREATININE 0.60  CKTOTAL 178*  CKMB 12.0*  TROPONINI <0.30   Estimated Creatinine Clearance: 34.9 ml/min (by C-G formula based on Cr of 0.6).  Medical History: Past Medical History  Diagnosis Date  . COPD (chronic obstructive pulmonary disease)   . HTN (hypertension)   . Hypothyroidism   . Osteoporosis   . Postherpetic neuralgia     Medications:  Prescriptions prior to admission  Medication Sig Dispense Refill  . beta carotene w/minerals (OCUVITE) tablet Take 1 tablet by mouth daily.        . cefdinir (OMNICEF) 300 MG capsule Take 300 mg by mouth 2 (two) times daily as needed. For shingles       . Fluticasone-Salmeterol (ADVAIR) 250-50 MCG/DOSE AEPB Inhale 1 puff into the lungs daily.        Marland Kitchen gabapentin (NEURONTIN) 300 MG capsule Take 300 mg by mouth daily as needed. For nerve pain from shingles       . levothyroxine (SYNTHROID, LEVOTHROID) 88 MCG tablet Take 88 mcg by mouth daily.        . mirtazapine (REMERON) 15 MG tablet Take 15 mg by mouth at bedtime.        Marland Kitchen omeprazole (PRILOSEC) 20 MG capsule Take 20 mg by mouth daily.        Marland Kitchen tiotropium (SPIRIVA) 18 MCG inhalation capsule Place 18 mcg into inhaler and inhale daily.          Assessment: 78 yo with new  onset afib and suspected recent tia  Goal of Therapy:  Heparin level 0.3-0.7 units/ml   Plan:  Heparin to begin with no bolus (per consult request) at 600 units/hr. Heparin level in 8 hours. Daily HL and cbc starting on 05-29-11   Janice Coffin 05/28/2011,1:17 AM

## 2011-05-28 NOTE — Progress Notes (Signed)
INITIAL ADULT NUTRITION ASSESSMENT Date: 05/28/2011   Time: 2:41 PM  Reason for Assessment: Nutrition Risk report  ASSESSMENT: Female 78 y.o.  Dx: Abnormal CXR with multiple nodules  Hx:  Past Medical History  Diagnosis Date  . COPD (chronic obstructive pulmonary disease)   . HTN (hypertension)   . Hypothyroidism   . Osteoporosis   . Postherpetic neuralgia     Related Meds:  Scheduled Meds:   . diltiazem  10 mg Intravenous Once  . diltiazem  30 mg Oral Q6H  . enoxaparin (LOVENOX) injection  40 mg Subcutaneous Q12H  . Fluticasone-Salmeterol  1 puff Inhalation BID  . gabapentin  300 mg Oral TID  . imipenem-cilastatin  500 mg Intravenous Q12H  . levothyroxine  88 mcg Oral QAC breakfast  . mirtazapine  15 mg Oral QHS  . pantoprazole  40 mg Oral Q1200  . tiotropium  18 mcg Inhalation Daily  . DISCONTD: diltiazem  10 mg Intravenous Once  . DISCONTD: Fluticasone-Salmeterol  1 puff Inhalation Daily  . DISCONTD: gabapentin  300 mg Oral TID  . DISCONTD: imipenem-cilastatin  500 mg Intravenous Q8H  . DISCONTD: levothyroxine  88 mcg Oral Daily  . DISCONTD: mirtazapine  15 mg Oral QHS  . DISCONTD: tiotropium  18 mcg Inhalation Daily   Continuous Infusions:   . sodium chloride 50 mL/hr at 05/28/11 1221  . DISCONTD: diltiazem (CARDIZEM) infusion 5 mg/hr (05/28/11 0600)  . DISCONTD: heparin 6 mL/hr (05/28/11 0600)   PRN Meds:.iohexol   Ht: 5\' 1"  (154.9 cm)  Wt: 89 lb 14.4 oz (40.778 kg)  Ideal Wt: 47.7 kg % Ideal Wt: 85%  Usual Wt: 93 lb % Usual Wt: 95.7%  Body mass index is 16.99 kg/(m^2).  Food/Nutrition Related Hx: Patient reports recent poor appetite, poor po intake and weight loss; likes chocolate Boost.  Labs:  CMP     Component Value Date/Time   NA 136 05/28/2011 0330   K 4.1 05/28/2011 0330   CL 103 05/28/2011 0330   CO2 29 05/28/2011 0330   GLUCOSE 113* 05/28/2011 0330   BUN 22 05/28/2011 0330   CREATININE 0.60 05/28/2011 0330   CALCIUM 8.6 05/28/2011 0330   PROT  6.6 05/27/2011 1735   ALBUMIN 2.9* 05/27/2011 1735   AST 48* 05/27/2011 1735   ALT 47* 05/27/2011 1735   ALKPHOS 214* 05/27/2011 1735   BILITOT 0.6 05/27/2011 1735   GFRNONAA 83* 05/28/2011 0330   GFRAA >90 05/28/2011 0330    CBG (last 3)   Basename 05/27/11 1702  GLUCAP 140*    Intake/Output Summary (Last 24 hours) at 05/28/11 1446 Last data filed at 05/28/11 1200  Gross per 24 hour  Intake 1106.38 ml  Output    425 ml  Net 681.38 ml   Diet Order: Regular  IVF:    sodium chloride Last Rate: 50 mL/hr at 05/28/11 1221  DISCONTD: diltiazem (CARDIZEM) infusion Last Rate: 5 mg/hr (05/28/11 0600)  DISCONTD: heparin Last Rate: 6 mL/hr (05/28/11 0600)    Estimated Nutritional Needs:   Kcal: 1200-1400  Protein: 60-70 grams Fluid: 1.2-1.4 liters  NUTRITION DIAGNOSIS: -Malnutrition (NI-5.2).  Status: Ongoing  RELATED TO: poor appetite with suboptimal intake due to PNA  AS EVIDENCED BY: BMI=16.99  MONITORING/EVALUATION(Goals): Goal:  Adequate intake (>50% meals and supplements) to promote repletion of nutrient stores. Monitor:  PO intake, labs, weight trend.  EDUCATION NEEDS: -Education needs addressed--discussed ways to increase calorie and protein intake to promote gradual weight gain.  INTERVENTION: Ensure Clinical Strength PO  BID (chocolate flavor).  Dietitian #:  [phone]  DOCUMENTATION CODES Per approved criteria  -Underweight -Severe malnutrition-chronic    Hettie Holstein 05/28/2011, 2:41 PM

## 2011-05-28 NOTE — Progress Notes (Signed)
  Patient Name: Michelle Hopkins      SUBJECTIVE:feeling better   CT  lkely multolobar pneumonia and not malignancy   Past Medical History  Diagnosis Date  . COPD (chronic obstructive pulmonary disease)   . HTN (hypertension)   . Hypothyroidism   . Osteoporosis   . Postherpetic neuralgia     PHYSICAL EXAM Filed Vitals:   05/28/11 0715 05/28/11 0800 05/28/11 0926 05/28/11 1200  BP: 120/71 120/71  125/77  Pulse: 71 85    Temp:  98.5 F (36.9 C)  98.8 F (37.1 C)  TempSrc:  Oral  Oral  Resp: 27 21  27   Height:      Weight:      SpO2: 92% 94% 94% 97%    Well developed and nourished in no acute distress HENT normal Neck supple with JVP-flat Carotids brisk and full without bruits Clear Irregularly irregular rate and rhythm with controlled  ventricular response, no murmurs or gallops Abd-soft with active BS without hepatomegaly No Clubbing cyanosis edema Skin-warm and dry A & Oriented  Grossly normal sensory and motor function  TELEMETRY: Reviewed telemetry pt in af with improved VR   Intake/Output Summary (Last 24 hours) at 05/28/11 1326 Last data filed at 05/28/11 1200  Gross per 24 hour  Intake 1106.38 ml  Output    425 ml  Net 681.38 ml    LABS: Basic Metabolic Panel:  Lab 05/28/11 2956 05/27/11 1735  NA 136 135  K 4.1 3.7  CL 103 97  CO2 29 27  GLUCOSE 113* 128*  BUN 22 27*  CREATININE 0.60 0.60  CALCIUM 8.6 9.3  MG -- --  PHOS -- --   Cardiac Enzymes:  Basename 05/27/11 1735  CKTOTAL 178*  CKMB 12.0*  CKMBINDEX --  TROPONINI <0.30   CBC:  Lab 05/28/11 0330 05/27/11 1735  WBC 12.8* 18.3*  NEUTROABS -- --  HGB 11.3* 12.6  HCT 34.9* 37.7  MCV 86.8 85.7  PLT 203 220   PROTIME:  Basename 05/27/11 1735  LABPROT 14.7  INR 1.13   Liver Function Tests:  Basename 05/27/11 1735  AST 48*  ALT 47*  ALKPHOS 214*  BILITOT 0.6  PROT 6.6  ALBUMIN 2.9*    Basename 05/27/11 1735  TSH 2.013  T4TOTAL --  T3FREE --  THYROIDAB --     ASSESSMENT AND PLAN:  Patient Active Hospital Problem List: Abnormal CXR with multiple nodules (05/27/2011)   Assessment: thankdfully appears to be non malignant  Still potentially big problem   Atrial fibrillation (05/27/2011)   Assessment:  HR improvedcontrol   Plan: would continue for now and add anticoagulation with coumadin;  Not sure risks of heparin are worth it    Signed, Sherryl Manges MD  05/28/2011

## 2011-05-29 ENCOUNTER — Inpatient Hospital Stay (HOSPITAL_COMMUNITY): Payer: Medicare Other

## 2011-05-29 DIAGNOSIS — I4891 Unspecified atrial fibrillation: Secondary | ICD-10-CM

## 2011-05-29 DIAGNOSIS — R7401 Elevation of levels of liver transaminase levels: Secondary | ICD-10-CM

## 2011-05-29 LAB — CBC
HCT: 35.3 % — ABNORMAL LOW (ref 36.0–46.0)
Hemoglobin: 11.4 g/dL — ABNORMAL LOW (ref 12.0–15.0)
MCH: 28.3 pg (ref 26.0–34.0)
MCHC: 32.3 g/dL (ref 30.0–36.0)
RDW: 14.1 % (ref 11.5–15.5)

## 2011-05-29 LAB — COMPREHENSIVE METABOLIC PANEL
Albumin: 2.3 g/dL — ABNORMAL LOW (ref 3.5–5.2)
BUN: 19 mg/dL (ref 6–23)
Calcium: 8.4 mg/dL (ref 8.4–10.5)
Creatinine, Ser: 0.51 mg/dL (ref 0.50–1.10)
GFR calc Af Amer: >90 mL/min (ref >90)
Glucose, Bld: 101 mg/dL — ABNORMAL HIGH (ref 70–99)
Total Protein: 5.5 g/dL — ABNORMAL LOW (ref 6.0–8.3)

## 2011-05-29 LAB — BLOOD GAS, ARTERIAL
Bicarbonate: 25.8 mEq/L — ABNORMAL HIGH (ref 20.0–24.0)
Patient temperature: 98.6
TCO2: 27.1 mmol/L (ref 0–100)
pH, Arterial: 7.375 (ref 7.350–7.400)
pO2, Arterial: 70.5 mmHg — ABNORMAL LOW (ref 80.0–100.0)

## 2011-05-29 MED ORDER — ALBUTEROL SULFATE (5 MG/ML) 0.5% IN NEBU
INHALATION_SOLUTION | RESPIRATORY_TRACT | Status: AC
  Administered 2011-05-29: 2.5 mg
  Filled 2011-05-29: qty 0.5

## 2011-05-29 MED ORDER — DILTIAZEM HCL ER COATED BEADS 180 MG PO CP24
180.0000 mg | ORAL_CAPSULE | Freq: Every day | ORAL | Status: DC
  Administered 2011-05-29 – 2011-06-02 (×5): 180 mg via ORAL
  Filled 2011-05-29 (×7): qty 1

## 2011-05-29 MED ORDER — WARFARIN SODIUM 4 MG PO TABS
4.0000 mg | ORAL_TABLET | Freq: Once | ORAL | Status: AC
  Administered 2011-05-29: 4 mg via ORAL
  Filled 2011-05-29: qty 1

## 2011-05-29 MED ORDER — PATIENT'S GUIDE TO USING COUMADIN BOOK
Freq: Once | Status: AC
  Administered 2011-05-29: 1
  Filled 2011-05-29: qty 1

## 2011-05-29 MED ORDER — WARFARIN VIDEO
Freq: Once | Status: AC
  Administered 2011-05-30: 16:00:00

## 2011-05-29 MED ORDER — ALBUTEROL SULFATE (5 MG/ML) 0.5% IN NEBU
2.5000 mg | INHALATION_SOLUTION | RESPIRATORY_TRACT | Status: DC | PRN
  Administered 2011-05-29 – 2011-05-30 (×2): 2.5 mg via RESPIRATORY_TRACT
  Filled 2011-05-29 (×2): qty 0.5

## 2011-05-29 MED ORDER — BIOTENE DRY MOUTH MT LIQD
15.0000 mL | Freq: Two times a day (BID) | OROMUCOSAL | Status: DC
  Administered 2011-05-29 – 2011-06-11 (×22): 15 mL via OROMUCOSAL

## 2011-05-29 NOTE — Progress Notes (Signed)
05-29-11  UR completed. Ronny Flurry RN BSN

## 2011-05-29 NOTE — Progress Notes (Signed)
Pt's O2 saturations are 85-89% on 4L, pt has very labored breathing, MD notified and orders given for patient to be transferred to stepdown, will continue to monitor patient until transfer___________________________D. Manson Passey RN

## 2011-05-29 NOTE — Progress Notes (Signed)
Pt transferred to stepdown, pt's O2 sats are 84%-86% on 4L_______________________________D. Manson Passey RN

## 2011-05-29 NOTE — Progress Notes (Signed)
Physical Therapy Cancellation Patient Details Name: Michelle Hopkins An MRN: [id number] DOB: 04/21/1927 Today's Date: 05/29/2011  Pt refused PT currently.  However pt very SOB when entering therefore took SaO2 which was initially 83% increased to 88% on 4L.  Notified RN.  Will hold PT eval.  Thanks!!!   Michelle Hopkins 05/29/2011, 2:04 PM [phone]

## 2011-05-29 NOTE — Progress Notes (Signed)
MD returned page and orders given, continuing to monitor patient____________________________D. Manson Passey RN

## 2011-05-29 NOTE — Plan of Care (Signed)
Problem: Phase II Progression Outcomes Goal: Progress activity as tolerated unless otherwise ordered Pt lives at home alone and should have 24 hour supervision and at home after acute discharge. Recommend HH OT after acute discharge

## 2011-05-29 NOTE — Progress Notes (Signed)
Patient's O2 sats are decreasing rapid response notified and as well as MD paged_________________D. Manson Passey RN

## 2011-05-29 NOTE — Progress Notes (Signed)
  Patient Name: Michelle Hopkins      SUBJECTIVE:feeling better    CT  likely multolobar pneumonia and not malignancy   Past Medical History  Diagnosis Date  . COPD (chronic obstructive pulmonary disease)   . HTN (hypertension)   . Hypothyroidism   . Osteoporosis   . Postherpetic neuralgia     PHYSICAL EXAM Filed Vitals:   05/28/11 1718 05/28/11 1843 05/28/11 2037 05/29/11 0611  BP: 118/67 117/70 131/97 127/74  Pulse: 57 92 89 94  Temp:  98 F (36.7 C) 98.3 F (36.8 C)   TempSrc:  Oral Oral   Resp: 18 20 24 20   Height:  5\' 2"  (1.575 m)    Weight:  102 lb 1.2 oz (46.3 kg)    SpO2: 95% 95% 91% 95%    Well developed and nourished in no acute distress HENT normal Neck supple with JVP-flat Carotids brisk and full without bruits Clear Irregularly irregular rate and rhythm with controlled  ventricular response, no murmurs or gallops Abd-soft with active BS without hepatomegaly No Clubbing cyanosis edema Skin-warm and dry A & Oriented  Grossly normal sensory and motor function  TELEMETRY: Reviewed telemetry pt in af with improved VR now in 80s or so I do not see the 50 recorded above   Intake/Output Summary (Last 24 hours) at 05/29/11 0746 Last data filed at 05/29/11 0726  Gross per 24 hour  Intake 1690.48 ml  Output    575 ml  Net 1115.48 ml    LABS: Basic Metabolic Panel:  Lab 05/29/11 1191 05/28/11 0330 05/27/11 1735  NA 137 136 135  K 3.9 4.1 3.7  CL 105 103 97  CO2 27 29 27   GLUCOSE 101* 113* 128*  BUN 19 22 27*  CREATININE 0.51 0.60 0.60  CALCIUM 8.4 8.6 --  MG -- -- --  PHOS -- -- --   Cardiac Enzymes:  Basename 05/27/11 1735  CKTOTAL 178*  CKMB 12.0*  CKMBINDEX --  TROPONINI <0.30   CBC:  Lab 05/29/11 0630 05/28/11 0330 05/27/11 1735  WBC 11.1* 12.8* 18.3*  NEUTROABS -- -- --  HGB 11.4* 11.3* 12.6  HCT 35.3* 34.9* 37.7  MCV 87.6 86.8 85.7  PLT 212 203 220   PROTIME:  Basename 05/27/11 1735  LABPROT 14.7  INR 1.13   Liver  Function Tests:  Basename 05/29/11 0630 05/27/11 1735  AST 56* 48*  ALT 57* 47*  ALKPHOS 211* 214*  BILITOT 0.3 0.6  PROT 5.5* 6.6  ALBUMIN 2.3* 2.9*    Basename 05/27/11 1735  TSH 2.013  T4TOTAL --  T3FREE --  THYROIDAB --    ASSESSMENT AND PLAN:  Patient Active Hospital Problem List: Pneumonia (05/27/2011)    Atrial fibrillation (05/27/2011)   Elevated transaminase level (05/29/2011)     Abnormal CXR with multiple nodules (05/27/2011)   Assessment: thankdfully appears to be non malignant  Still multilobar pneumonia  potentially big problem   Atrial fibrillation (05/27/2011)   Assessment:  HR improvedcontrol   Plan: would continue for now and add anticoagulation with coumadin;    Will change dilt from q6h to daily      Signed, Sherryl Manges MD  05/29/2011

## 2011-05-29 NOTE — Progress Notes (Signed)
Called by bedside RN to evaluate patient with decreasing oxygen saturation and increased WOB.  Upon arrival to patients room, bedside RN at patients bedside, patient noted to be using accessory muscles, increased WOB.  Nasal cannula @ 4lpm sats 89% HR 95 BP 116/46.  Breath sounds with diminished lung sounds and expiratory wheezes heard.   Albuterol treatment given as per MD order oxygen saturation up to 93% after breathing treatment WOB improved and daughter in law at bedside states patient is close to baseline.  Patient stated she feels better and more comfortable.  Will continue to monitor and follow up

## 2011-05-29 NOTE — Progress Notes (Signed)
Subjective: Feels well.  Still requiring a fair amount of oxygen.  No pain or fluttering in her chest.  "I'm in need of a cup of coffee"  Objective: Vital signs in last 24 hours: Temp:  [98 F (36.7 C)-98.8 F (37.1 C)] 98.3 F (36.8 C) (01/03 2037) Pulse Rate:  [57-94] 94  (01/04 0611) Resp:  [18-27] 20  (01/04 0611) BP: (117-131)/(67-97) 127/74 mmHg (01/04 0611) SpO2:  [91 %-98 %] 95 % (01/04 0611) Weight:  [102 lb 1.2 oz (46.3 kg)] 102 lb 1.2 oz (46.3 kg) (01/03 1843) Weight change: 12 lb 2.8 oz (5.522 kg) Last BM Date: 05/27/11  Intake/Output from previous day: 01/03 0701 - 01/04 0700 In: 1450.5 [P.O.:720; I.V.:630.5; IV Piggyback:100] Out: 425 [Urine:425] Intake/Output this shift:   General appearance: alert, cooperative, appears stated age and no distress  Resp: diminished breath sounds bibasilar  Chest wall: no tenderness  Cardio: irregularly irregular rhythm  GI: soft, non-tender; bowel sounds normal; no masses, no organomegaly  Extremities: extremities normal, atraumatic, no cyanosis or edema  Skin: Mild medial R forearm bruise.   Lab Results:  Basename 05/29/11 0630 05/28/11 0330  WBC 11.1* 12.8*  HGB 11.4* 11.3*  HCT 35.3* 34.9*  PLT 212 203   BMET  Basename 05/28/11 0330 05/27/11 1735  NA 136 135  K 4.1 3.7  CL 103 97  CO2 29 27  GLUCOSE 113* 128*  BUN 22 27*  CREATININE 0.60 0.60  CALCIUM 8.6 9.3    Studies/Results: Ct Head Wo Contrast  05/27/2011  *RADIOLOGY REPORT*  Clinical Data: Optic nerves swelling.  Visual loss.  Abnormal chest x-ray.  CT HEAD WITHOUT CONTRAST  Technique:  Contiguous axial images were obtained from the base of the skull through the vertex without contrast.  Comparison: None.  Findings: There is diffuse cerebral atrophy.  Ventricular dilatation likely representing central atrophy.  No mass effect or midline shift.  No abnormal extra-axial fluid collections.  Wallace Cullens- white matter junctions are distinct.  Basal cisterns are not  effaced.  No evidence of acute intracranial hemorrhage.  Vascular calcifications.  Opacification of the right frontal sinus. Paranasal sinuses are otherwise not opacified.  No depressed skull fractures.  The globes appear intact with irregular contours.  No infiltration in the retrobulbar fat.  IMPRESSION: Cerebral atrophy.  No evidence of acute intracranial hemorrhage, mass lesion, or acute infarct.  Nothing to suggest increased intracranial pressure. Opacification of the right frontal sinus.  Original Report Authenticated By: Marlon Pel, M.D.   Ct Chest W Contrast  05/27/2011  *RADIOLOGY REPORT*  Clinical Data: Abnormal chest x-ray.  Hypoxemia with weight loss.  CT CHEST WITH CONTRAST  Technique:  Multidetector CT imaging of the chest was performed following the standard protocol during bolus administration of intravenous contrast.  Contrast: 80mL OMNIPAQUE IOHEXOL 300 MG/ML IV SOLN  Comparison: Chest radiographs 10/12/2005 and 09/30/2006.  No recent studies available.  Findings: The patient appears cachectic.  Small mediastinal and hilar lymph nodes are not pathologically enlarged.  There are is mild diffuse aortic atherosclerosis.  The pulmonary arteries appear patent.  There are small left and trace right pleural effusions.  There is no significant pericardial effusion.  There are dependent airspace opacities in both lower lobes with consolidation and air bronchograms.  In addition, there are patchy opacities within the lingula and right middle lobe.  Underlying emphysematous changes are present.  No endobronchial lesion is identified.  The visualized upper abdomen is unremarkable.  There is no evidence of adrenal  mass.  There is a mild scoliosis.  IMPRESSION:  1.  Multifocal air space opacities in both lungs most consistent with multilobar pneumonia. 2.  Small left greater than right pleural effusions. 3.  No dominant mass, endobronchial lesion or significant lymphadenopathy.  Radiographic followup  is recommended to exclude an atypical neoplastic process.  Original Report Authenticated By: Gerrianne Scale, M.D.    Medications:  I have reviewed the patient's current medications. Scheduled:   . diltiazem  30 mg Oral Q6H  . enoxaparin (LOVENOX) injection  40 mg Subcutaneous Q12H  . feeding supplement  237 mL Oral BID BM  . Fluticasone-Salmeterol  1 puff Inhalation BID  . gabapentin  300 mg Oral TID  . imipenem-cilastatin  500 mg Intravenous Q12H  . levothyroxine  88 mcg Oral QAC breakfast  . mirtazapine  15 mg Oral QHS  . pantoprazole  40 mg Oral Q1200  . tiotropium  18 mcg Inhalation Daily  . DISCONTD: imipenem-cilastatin  500 mg Intravenous Q8H   Continuous:   . sodium chloride 50 mL/hr at 05/29/11 0607  . DISCONTD: diltiazem (CARDIZEM) infusion Stopped (05/28/11 1025)  . DISCONTD: heparin Stopped (05/28/11 0914)   PRN:  Assessment/Plan: 1) Multilobar Pneumonia. Continue Primaxin. The CT chest did not show indication of malignancy. CXR pending for this AM 2) Afib. TSH normal, most likely related to her respiratory illness. Cards following with an Echo showing normal LVEF, but moderate MR and TR with higher R sided pressures.. Oral Cardizem and subq Lovenox for ease of administration. Will  Initiate Coumadin per Cards recommendation.  We will be able to monitor as outpt through our office's coumadin clinic. 3) Osteoporosis: As above.  4) Weight loss. Daughter has primarily noted decreased appetite in the past couple of weeks due to her illness. Might be a manifestation of her having the PNA. Nutrition consult + Ensure added. 5) HTN Controlled, Cardizem as well.  6) WBC count coming down to 11 today. 7) Herpetic neuralgia: Continue Neurontin.  8) PT/OT, on Tele floor, will have her gett OOB/chair today and can be up for her assessments.  Will need portable oxygen if in halls.  WIll likely need to be here through the weekend while her oxygenation improves. LOS: 2 days    Tiyon Sanor W 05/29/2011, 7:10 AM

## 2011-05-29 NOTE — Progress Notes (Signed)
ANTICOAGULATION CONSULT NOTE - Follow Up Consult  Pharmacy Consult for Lovenox, add Coumadin Indication: atrial fibrillation  Allergies  Allergen Reactions  . Avelox (Moxifloxacin Hcl In Nacl)   . Canarium     Unknown reaction  . Codeine   . Latex     Rash   . Neosporin (Neomycin-Polymyxin B)   . Rice   . Tape     Rips skin    Patient Measurements: Height: 5\' 2"  (157.5 cm) Weight: 102 lb 1.2 oz (46.3 kg) IBW/kg (Calculated) : 50.1   Vital Signs: Temp: 98.6 F (37 C) (01/04 1411) Temp src: Oral (01/04 1411) BP: 112/67 mmHg (01/04 1411) Pulse Rate: 78  (01/04 1411)  Labs:  Basename 05/29/11 0630 05/28/11 0330 05/27/11 1735  HGB 11.4* 11.3* --  HCT 35.3* 34.9* 37.7  PLT 212 203 220  APTT -- -- --  LABPROT -- -- 14.7  INR -- -- 1.13  HEPARINUNFRC -- -- --  CREATININE 0.51 0.60 0.60  CKTOTAL -- -- 178*  CKMB -- -- 12.0*  TROPONINI -- -- <0.30   Estimated Creatinine Clearance: 39.6 ml/min (by C-G formula based on Cr of 0.51).  Assessment:    Currently on Lovenox 40 mg SQ q12h  - full-dose.    Coumadin to begin today.  Will begin conservatively and target low-therapeutic INR.  Concern for balance/fall risk.  Goal of Therapy:     Full anticoagulation    INR 2-2.5 (low therapeutic)    Plan:     Will begin Coumadin with 4 mg PO tonight.    Daily PT/INR to begin 1/5.    Will provide education materials.    Continue Lovenox 40 mg SQ q12h.  CBC in AM.  Dennie Fetters Pager:  [phone] 05/29/2011,3:45 PM

## 2011-05-29 NOTE — Progress Notes (Signed)
Occupational Therapy Evaluation Patient Details Name: Michelle Hopkins MRN: [id number] DOB: 11-02-1926 Today's Date: 05/29/2011  Problem List:  Patient Active Problem List  Diagnoses  .  Atrial fibrillation  . Pneumonia  . Weight loss  . Unspecified hypothyroidism  . Osteoporosis, unspecified  . Essential hypertension, benign  . Chronic airway obstruction, not elsewhere classified  . Herpes zoster with other nervous system complications  . Unspecified vitamin D deficiency  . Shortness of breath  . Elevated transaminase level    Past Medical History:  Past Medical History  Diagnosis Date  . COPD (chronic obstructive pulmonary disease)   . HTN (hypertension)   . Hypothyroidism   . Osteoporosis   . Postherpetic neuralgia    Past Surgical History:  Past Surgical History  Procedure Date  . Appendectomy 1996  . Left hip repair s/p fracture 2004    OT Assessment/Plan/Recommendation OT Assessment Clinical Impression Statement: Pt demos decline in function with strength, balance, safety and activity tolerance for ADLs and ADL mobility. Pt would benefit from skilled OT services to address these impairments to increase independence and maximize level of function OT Recommendation/Assessment: Patient will need skilled OT in the acute care venue OT Problem List: Decreased strength;Decreased activity tolerance;Decreased safety awareness;Decreased knowledge of use of DME or AE;Impaired balance (sitting and/or standing);Decreased knowledge of precautions Barriers to Discharge: Decreased caregiver support Barriers to Discharge Comments: Pt lives at home alone, but pt's dtr able to be with her except for 2 1/2 hours out of the day OT Therapy Diagnosis : Generalized weakness OT Plan OT Frequency: Min 2X/week OT Treatment/Interventions: Self-care/ADL training;Therapeutic activities;Therapeutic exercise;Neuromuscular education;DME and/or AE instruction;Patient/family education;Balance  training OT Recommendation Recommendations for Other Services: PT consult Follow Up Recommendations: Home health OT, pt's daughter states that she cab assist her mother prn except for 2 1/2 hours during the day Equipment Recommended: 3 in 1 bedside comode;Rolling walker with 5" wheels Individuals Consulted Consulted and Agree with Results and Recommendations: Patient;Family member/caregiver Family Member Consulted: Pt's daughter OT Goals Acute Rehab OT Goals OT Goal Formulation: With patient/family Time For Goal Achievement: 7 days ADL Goals Pt Will Perform Grooming: with set-up;with supervision;Standing at sink ADL Goal: Grooming - Progress: Not met Pt Will Perform Lower Body Bathing: with set-up;with supervision;Sitting in shower;Sitting at sink ADL Goal: Lower Body Bathing - Progress: Not met Pt Will Perform Lower Body Dressing: with set-up;with supervision;Sitting, chair;Sit to stand from chair ADL Goal: Lower Body Dressing - Progress: Not met Pt Will Transfer to Toilet: with supervision;with DME;Grab bars ADL Goal: Toilet Transfer - Progress: Not met Pt Will Perform Toileting - Clothing Manipulation: with supervision;Standing ADL Goal: Toileting - Clothing Manipulation - Progress: Not met Additional ADL Goal #1: Pt will complete UB bathing and dressing seated EOB or at sink with set up only  OT Evaluation Precautions/Restrictions  Precautions Precautions: Fall Required Braces or Orthoses: No Restrictions Weight Bearing Restrictions: No Prior Functioning Home Living Lives With: Alone Receives Help From: Family;Other (Comment) (hired help for housekeeping) Type of Home: House Home Layout: One level Home Access: Stairs to enter Entrance Stairs-Rails: None Entrance Stairs-Number of Steps: 1 Bathroom Shower/Tub: Psychologist, counselling;Door [employer] Toilet: Standard Bathroom Accessibility: Yes How Accessible: Accessible via walker Home Adaptive Equipment: Grab bars in  shower Prior Function Level of Independence: Independent with basic ADLs;Independent with homemaking with ambulation;Independent with gait;Independent with transfers;Needs assistance with homemaking;Other (comment) (Independent with light meal prep and grocery shopping) Light Housekeeping: Total Driving: Yes Vocation: Retired ADL ADL Eating/Feeding: Not assessed Grooming: Performed;Wash/dry  hands;Wash/dry face;Brushing hair;Minimal assistance Grooming Details (indicate cue type and reason): min A for balance and safety stading at sink, posterior leaning Where Assessed - Grooming: Standing at sink Upper Body Bathing: Simulated;Supervision/safety;Set up Where Assessed - Upper Body Bathing: Sitting, bed Lower Body Bathing: Simulated;Minimal assistance Where Assessed - Lower Body Bathing: Sitting, bed;Sit to stand from bed Upper Body Dressing: Performed;Supervision/safety;Set up Where Assessed - Upper Body Dressing: Sitting, bed Lower Body Dressing: Performed;Minimal assistance Lower Body Dressing Details (indicate cue type and reason): doffing and donning socks Where Assessed - Lower Body Dressing: Sitting, bed Toilet Transfer: Simulated;Minimal assistance Toilet Transfer Method: Stand pivot Toilet Transfer Equipment: Other (comment) (simulated bed - chair) Toileting - Clothing Manipulation: Minimal assistance Where Assessed - Toileting Clothing Manipulation: Standing Toileting - Hygiene: Not assessed Where Assessed - Toileting Hygiene: Not assessed Tub/Shower Transfer: Not assessed Tub/Shower Transfer Method: Not assessed Equipment Used: Other (comment) (gait belt) Vision/Perception  Vision - History Baseline Vision: Wears glasses all the time Visual History: Macular degeneration Patient Visual Report: No change from baseline Perception Perception: Within Functional Limits Cognition Cognition Arousal/Alertness: Awake/alert Overall Cognitive Status: Appears within functional  limits for tasks assessed Sensation/Coordination Sensation Light Touch: Appears Intact Coordination Gross Motor Movements are Fluid and Coordinated: Yes Fine Motor Movements are Fluid and Coordinated: Yes Extremity Assessment RUE Assessment RUE Assessment: Within Functional Limits (MMT 3+/5) LUE Assessment LUE Assessment: Within Functional Limits (MMT 3+/5) Mobility  Bed Mobility Bed Mobility: Yes Sit to Supine - Left: Other (comment) (min guard assist) Scooting to HOB: 5: Supervision Transfers Transfers: Yes Sit to Stand: 4: Min assist;From bed;From chair/3-in-1 Stand to Sit: 4: Min assist;To bed;To chair/3-in-1  End of Session OT - End of Session Equipment Utilized During Treatment: Gait belt Activity Tolerance: Patient tolerated treatment well Patient left: in bed;with call bell in reach;with family/visitor present;with bed alarm set General Behavior During Session: [facility] for tasks performed Cognition: [facility] for tasks performed   Galen Manila 05/29/2011, 12:37 PM

## 2011-05-30 LAB — COMPREHENSIVE METABOLIC PANEL
ALT: 51 U/L — ABNORMAL HIGH (ref 0–35)
Alkaline Phosphatase: 184 U/L — ABNORMAL HIGH (ref 39–117)
BUN: 19 mg/dL (ref 6–23)
CO2: 30 mEq/L (ref 19–32)
Chloride: 105 mEq/L (ref 96–112)
GFR calc Af Amer: >90 mL/min (ref >90)
GFR calc non Af Amer: 88 mL/min — ABNORMAL LOW (ref >90)
Glucose, Bld: 96 mg/dL (ref 70–99)
Potassium: 4.2 mEq/L (ref 3.5–5.1)
Sodium: 140 mEq/L (ref 135–145)
Total Bilirubin: 0.3 mg/dL (ref 0.3–1.2)

## 2011-05-30 LAB — PROTIME-INR: Prothrombin Time: 14 seconds (ref 11.6–15.2)

## 2011-05-30 LAB — CBC
HCT: 35.5 % — ABNORMAL LOW (ref 36.0–46.0)
Hemoglobin: 11.3 g/dL — ABNORMAL LOW (ref 12.0–15.0)
MCHC: 31.8 g/dL (ref 30.0–36.0)
RBC: 3.98 MIL/uL (ref 3.87–5.11)
WBC: 8.6 10*3/uL (ref 4.0–10.5)

## 2011-05-30 MED ORDER — LEVALBUTEROL HCL 0.63 MG/3ML IN NEBU
0.6300 mg | INHALATION_SOLUTION | Freq: Four times a day (QID) | RESPIRATORY_TRACT | Status: DC
  Administered 2011-05-30 – 2011-06-05 (×22): 0.63 mg via RESPIRATORY_TRACT
  Filled 2011-05-30 (×27): qty 3

## 2011-05-30 MED ORDER — WARFARIN SODIUM 4 MG PO TABS
4.0000 mg | ORAL_TABLET | Freq: Once | ORAL | Status: AC
  Administered 2011-05-30: 4 mg via ORAL
  Filled 2011-05-30: qty 1

## 2011-05-30 MED ORDER — SODIUM CHLORIDE 0.9 % IV SOLN
250.0000 mg | Freq: Two times a day (BID) | INTRAVENOUS | Status: DC
  Administered 2011-05-30 – 2011-06-01 (×4): 250 mg via INTRAVENOUS
  Filled 2011-05-30 (×5): qty 250

## 2011-05-30 MED ORDER — FUROSEMIDE 10 MG/ML IJ SOLN
40.0000 mg | Freq: Once | INTRAMUSCULAR | Status: AC
  Administered 2011-05-30: 40 mg via INTRAVENOUS
  Filled 2011-05-30: qty 4

## 2011-05-30 NOTE — Progress Notes (Signed)
ANTICOAGULATION CONSULT NOTE - Follow Up Consult  Pharmacy Consult for Lovenox, add Coumadin Indication: atrial fibrillation  Allergies  Allergen Reactions  . Avelox (Moxifloxacin Hcl In Nacl)   . Canarium     Unknown reaction  . Codeine   . Latex     Rash   . Neosporin (Neomycin-Polymyxin B)   . Rice   . Tape     Rips skin    Patient Measurements: Height: 5\' 2"  (157.5 cm) Weight: 98 lb 8.7 oz (44.7 kg) IBW/kg (Calculated) : 50.1   Vital Signs: Temp: 97.7 F (36.5 C) (01/05 1344) Temp src: Axillary (01/05 1154) BP: 123/84 mmHg (01/05 1344) Pulse Rate: 93  (01/05 1344)  Labs:  Basename 05/30/11 0600 05/29/11 0630 05/28/11 0330 05/27/11 1735  HGB 11.3* 11.4* -- --  HCT 35.5* 35.3* 34.9* --  PLT 241 212 203 --  APTT -- -- -- --  LABPROT 14.0 -- -- 14.7  INR 1.06 -- -- 1.13  HEPARINUNFRC -- -- -- --  CREATININE 0.50 0.51 0.60 --  CKTOTAL -- -- -- 178*  CKMB -- -- -- 12.0*  TROPONINI -- -- -- <0.30   Estimated Creatinine Clearance: 38.3 ml/min (by C-G formula based on Cr of 0.5).  Assessment: 78 yo female with afib and  Lovenox 40 mg SQ q12h  - full-dose and coumadin  Goal of Therapy:   -Full anticoagulation  -INR 2-2.5 (low therapeutic)    Plan:  - Continue 4mg  coumadin today  Benny Lennert 05/30/2011,2:36 PM

## 2011-05-30 NOTE — Plan of Care (Signed)
Problem: Phase II Progression Outcomes Goal: Progress activity as tolerated unless otherwise ordered Outcome: Progressing Limited by SOB

## 2011-05-30 NOTE — Progress Notes (Signed)
Subjective: Had increased work of breathing yesterday while on the floor.  ABG showed some mild acidosis and CO2 retention.  Doing well this AM and looks like she is back close to yesterday's AM exam.  Son at bedside.  Objective: Vital signs in last 24 hours: Temp:  [98 F (36.7 C)-99.5 F (37.5 C)] 99.1 F (37.3 C) (01/05 0800) Pulse Rate:  [77-100] 77  (01/05 0419) Resp:  [16-23] 19  (01/05 0419) BP: (100-151)/(56-121) 113/77 mmHg (01/05 0419) SpO2:  [85 %-96 %] 93 % (01/05 0419) Weight:  [98 lb 8.7 oz (44.7 kg)] 98 lb 8.7 oz (44.7 kg) (01/05 0022) Weight change: -3 lb 8.4 oz (-1.6 kg) Last BM Date: 05/27/11  Intake/Output from previous day: 01/04 0701 - 01/05 0700 In: 2840 [P.O.:840; I.V.:1800; IV Piggyback:200] Out: 175 [Urine:175] Intake/Output this shift:   General appearance: alert, cooperative, appears stated age and no distress  Resp: diminished breath sounds bibasilar  Chest wall: no tenderness  Cardio: irregularly irregular rhythm  GI: soft, non-tender; bowel sounds normal; no masses, no organomegaly  Extremities: extremities normal, atraumatic, no cyanosis or edema  Skin: Mild medial R forearm bruise improving     Lab Results:  Basename 05/30/11 0600 05/29/11 0630  WBC 8.6 11.1*  HGB 11.3* 11.4*  HCT 35.5* 35.3*  PLT 241 212   BMET  Basename 05/30/11 0600 05/29/11 0630  NA 140 137  K 4.2 3.9  CL 105 105  CO2 30 27  GLUCOSE 96 101*  BUN 19 19  CREATININE 0.50 0.51  CALCIUM 8.5 8.4    Studies/Results: Dg Chest 1 View  05/29/2011  *RADIOLOGY REPORT*  Clinical Data: Follow up pneumonia, weakness  CHEST - 1 VIEW  Comparison: CT scan of the chest 05/27/2011  Findings: Cardiomediastinal silhouette is stable.  Bilateral patchy infiltrate/pneumonia in the lower lobe again noted.  Stable small bilateral pleural effusions left greater than right.  No convincing pulmonary edema.  There is mild dextroscoliosis upper thoracic spine and mild levoscoliosis lower  thoracic spine.  Mild hyperinflation again noted.  IMPRESSION: Bilateral patchy infiltrate/pneumonia and lower lobe again noted. Stable bilateral small pleural effusions left greater than right. Thoracic spine scoliosis.  Original Report Authenticated By: Natasha Mead, M.D.    Medications:  I have reviewed the patient's current medications. Scheduled:   . albuterol      . antiseptic oral rinse  15 mL Mouth Rinse BID  . diltiazem  180 mg Oral Daily  . enoxaparin (LOVENOX) injection  40 mg Subcutaneous Q12H  . feeding supplement  237 mL Oral BID BM  . Fluticasone-Salmeterol  1 puff Inhalation BID  . gabapentin  300 mg Oral TID  . imipenem-cilastatin  500 mg Intravenous Q12H  . levothyroxine  88 mcg Oral QAC breakfast  . mirtazapine  15 mg Oral QHS  . pantoprazole  40 mg Oral Q1200  . patient's guide to using coumadin book   Does not apply Once  . tiotropium  18 mcg Inhalation Daily  . warfarin  4 mg Oral ONCE-1800  . warfarin   Does not apply Once   Continuous:   . sodium chloride 50 mL/hr at 05/30/11 0351   RUE:[id number]  Assessment/Plan: 1) Multilobar Pneumonia. Continue Primaxin. The CT chest did not show indication of malignancy. CXR pending for tomorrow AM   Will keep in Stepdown another day. 2) Afib. TSH normal, most likely related to her respiratory illness. Cards following with an Echo showing normal LVEF, but moderate MR and TR with  higher R sided pressures.. Oral Cardizem CD and subq Lovenox for ease of administration. Coumadin per Cards recommendation. We will be able to monitor as outpt through our office's coumadin clinic.  3) Osteoporosis: As above.  4) Weight loss. Daughter has primarily noted decreased appetite in the past couple of weeks due to her illness. Might be a manifestation of her having the PNA. Nutrition consult + Ensure added. Son told to bring in outside food. 5) HTN Controlled, Cardizem as well.  6) WBC count coming down, 8.6.  7) Herpetic neuralgia:  Continue Neurontin.  8) PT/OT as capable in step down, incentive spirometer.  LOS: 3 days   TISOVEC,RICHARD W 05/30/2011, 8:10 AM

## 2011-05-30 NOTE — Progress Notes (Signed)
  Patient Name: Michelle Hopkins      SUBJECTIVE: Transferred to step down yesterday because of increased work of breathing. She says she feels better today. Past Medical History  Diagnosis Date  . COPD (chronic obstructive pulmonary disease)   . HTN (hypertension)   . Hypothyroidism   . Osteoporosis   . Postherpetic neuralgia     PHYSICAL EXAM Filed Vitals:   05/30/11 1024 05/30/11 1154 05/30/11 1156 05/30/11 1344  BP:    123/84  Pulse:  84  93  Temp:  97.7 F (36.5 C)  97.7 F (36.5 C)  TempSrc:  Axillary    Resp:  25  26  Height:      Weight:      SpO2: 89% 95% 97% 97%    Well developed and nourished in moderate respiratory distress  HENT normal Neck supple with JVP-flat Carotids brisk and full without bruits Wheezes and rhonchi bilaterally Irregularly irregular rate and rhythm with rapid   ventricular response, no murmurs or gallops Abd-soft with active BS without hepatomegaly No Clubbing cyanosis edema Skin-warm and dry A & Oriented  Grossly normal sensory and motor function  TELEMETRY: Reviewed telemetry pt in af with  VR now in the 110s  Intake/Output Summary (Last 24 hours) at 05/30/11 1507 Last data filed at 05/30/11 1300  Gross per 24 hour  Intake 2582.83 ml  Output    175 ml  Net 2407.83 ml   Its not clear whethr teh above are right or wrong !!!!! LABS: Basic Metabolic Panel:  Lab 05/30/11 1610 05/29/11 0630 05/28/11 0330 05/27/11 1735  NA 140 137 136 135  K 4.2 3.9 4.1 3.7  CL 105 105 103 97  CO2 30 27 29 27   GLUCOSE 96 101* 113* 128*  BUN 19 19 22  27*  CREATININE 0.50 0.51 0.60 0.60  CALCIUM 8.5 8.4 -- --  MG -- -- -- --  PHOS -- -- -- --   Cardiac Enzymes:  Basename 05/27/11 1735  CKTOTAL 178*  CKMB 12.0*  CKMBINDEX --  TROPONINI <0.30   CBC:  Lab 05/30/11 0600 05/29/11 0630 05/28/11 0330 05/27/11 1735  WBC 8.6 11.1* 12.8* 18.3*  NEUTROABS -- -- -- --  HGB 11.3* 11.4* 11.3* 12.6  HCT 35.5* 35.3* 34.9* 37.7  MCV 89.2 87.6  86.8 85.7  PLT 241 212 203 220   PROTIME:  Basename 05/30/11 0600 05/27/11 1735  LABPROT 14.0 14.7  INR 1.06 1.13   Liver Function Tests:  Basename 05/30/11 0600 05/29/11 0630  AST 39* 56*  ALT 51* 57*  ALKPHOS 184* 211*  BILITOT 0.3 0.3  PROT 5.3* 5.5*  ALBUMIN 2.2* 2.3*    Basename 05/27/11 1735  TSH 2.013  T4TOTAL --  T3FREE --  THYROIDAB --    ASSESSMENT AND PLAN:  Patient Active Hospital Problem List: Pneumonia (05/27/2011)    Atrial fibrillation (05/27/2011)   Elevated transaminase level (05/29/2011)      she continues to have atrial fibrillation in the setting of her pneumonia with a rapid ventricular response and I would not be surprised if there is a component of diastolic failure contributing to her shortness of breath.  To that end I will measure BNP. We'll treat with low-dose Lasix. We'll change her albuterol to Xopenex.     Signed, Sherryl Manges MD  05/30/2011

## 2011-05-30 NOTE — Progress Notes (Signed)
Patient and family concerned about  increased work of breathing and fatigue with frequent use of BSC s/p lasix iv.Patient and family requested foley catheter.family and patient educated about  risks associated with foley catheter including UTI and verbalized understanding.MD made aware and received order to place foley.

## 2011-05-30 NOTE — Progress Notes (Signed)
Physical Therapy Evaluation Patient Details Name: Michelle Hopkins MRN: [id number] DOB: 07/14/1926 Today's Date: 05/30/2011  Problem List:  Patient Active Problem List  Diagnoses  .  Atrial fibrillation  . Pneumonia  . Weight loss  . Unspecified hypothyroidism  . Osteoporosis, unspecified  . Essential hypertension, benign  . Chronic airway obstruction, not elsewhere classified  . Herpes zoster with other nervous system complications  . Unspecified vitamin D deficiency  . Shortness of breath  . Elevated transaminase level    Past Medical History:  Past Medical History  Diagnosis Date  . COPD (chronic obstructive pulmonary disease)   . HTN (hypertension)   . Hypothyroidism   . Osteoporosis   . Postherpetic neuralgia    Past Surgical History:  Past Surgical History  Procedure Date  . Appendectomy 1996  . Left hip repair s/p fracture 2004    PT Assessment/Plan/Recommendation PT Assessment Clinical Impression Statement: Pt presents to [facility] with increased SOB. She lives alone and reports independence with most ADLs including driving, ambulation etc. Reports one fall within the last year. Pt appears visibly SOB upon my entry to the room bordering 89-91% on 6 L [place] just lying in the bed. We were unable to complete much during her eval given her O2Sats and her decreased activity tolerance. Also demonstrates generalized weakness and balance deficits. Will benefit from physical therapy in the acute setting to maximize mobility, independence and activity tolerance for safety upon d/c home. If she is to d/c home I would recommend initally she have 24 hour assist and as her activity tolerance and mobilty improves with therapies this could taper off. Per pt she has available support at home.  PT Recommendation/Assessment: Patient will need skilled PT in the acute care venue PT Problem List: Decreased strength;Decreased activity tolerance;Decreased balance;Decreased mobility;Cardiopulmonary status  limiting activity PT Therapy Diagnosis : Difficulty walking;Abnormality of gait;Generalized weakness PT Plan PT Frequency: Min 3X/week PT Treatment/Interventions: DME instruction;Gait training;Stair training;Functional mobility training;Therapeutic activities;Therapeutic exercise;Balance training;Neuromuscular re-education;Patient/family education PT Recommendation Follow Up Recommendations: Home health PT with 24 hour supervision/assistance initially; family would also like to explore the option for home health aide Equipment Recommended: None recommended by PT PT Goals  Acute Rehab PT Goals PT Goal Formulation: With patient Pt will go Supine/Side to Sit: with modified independence PT Goal: Supine/Side to Sit - Progress: Progressing toward goal Pt will Sit at [facility] of Bed: with modified independence;3-5 min;with no upper extremity support (with O2 sats >/=91%) PT Goal: Sit at Edge Of Bed - Progress: Progressing toward goal Pt will go Sit to Supine/Side: with modified independence PT Goal: Sit to Supine/Side - Progress: Progressing toward goal Pt will go Sit to Stand: with modified independence PT Goal: Sit to Stand - Progress: Progressing toward goal Pt will go Stand to Sit: with modified independence PT Goal: Stand to Sit - Progress: Progressing toward goal Pt will Transfer Bed to Chair/Chair to Bed: with modified independence PT Transfer Goal: Bed to Chair/Chair to Bed - Progress: Progressing toward goal Pt will Stand: with modified independence;with bilateral upper extremity support;3 - 5 min PT Goal: Stand - Progress: Progressing toward goal Pt will Ambulate: 16 - 50 feet;with least restrictive assistive device;with modified independence PT Goal: Ambulate - Progress: Progressing toward goal Pt will Go Up / Down Stairs: 1-2 stairs;with min assist;with least restrictive assistive device PT Goal: Up/Down Stairs - Progress: Progressing toward goal Pt will Perform Home Exercise Program:  Independently PT Goal: Perform Home Exercise Program - Progress: Progressing toward goal  PT Evaluation Precautions/Restrictions  Precautions Precautions: Fall Restrictions Weight Bearing Restrictions: No Prior Functioning  Home Living Lives With: Alone Receives Help From: Family;Other (Comment) (daughter comes by every other day) Type of Home: House Home Layout: One level Home Access: Stairs to enter [employer] of Steps: 1 Home Adaptive Equipment: Walker - rolling Additional Comments: reports either her daughter or daughter in law can come stay with her Prior Function Level of Independence: Independent with gait Driving: Yes Vocation: Retired Financial risk analyst Arousal/Alertness: Awake/alert Overall Cognitive Status: Appears within functional limits for tasks assessed Sensation/Coordination Sensation Light Touch: Appears Intact Coordination Gross Motor Movements are Fluid and Coordinated: Yes Fine Motor Movements are Fluid and Coordinated: Yes Extremity Assessment RUE Assessment RUE Assessment:  (see OT note) LUE Assessment LUE Assessment:  (see OT note) RLE Assessment RLE Assessment:  (grossly 4/5; definitely generally weak however) LLE Assessment LLE Assessment:  (generalized weakness noted with functional activity) Mobility (including Balance) Bed Mobility Bed Mobility: Yes Supine to Sit: 4: Min assist Supine to Sit Details (indicate cue type and reason): min tactile cues for follow through; pt relatively independent affected mostly by SOB and activity tolerance Sitting - Scoot to Edge of Bed: 7: Independent Sit to Supine - Left: 5: Supervision Scooting to HOB: 1: +2 Total assist Scooting to [facility] Details (indicate cue type and reason): to conserve her engery we assisted scooting HOB +2total pt0% Transfers Sit to Stand: 4: Min assist;From bed;With upper extremity assist Sit to Stand Details (indicate cue type and reason): min tactile cueing to steady  her; she utilized the backs of her legs to assist with her balance initially upon standing Stand to Sit: To bed;With upper extremity assist;4: Min assist Ambulation/Gait Ambulation/Gait: Yes Ambulation/Gait Assistance: 4: Min assist Ambulation/Gait Assistance Details (indicate cue type and reason): pregait marching at EOB only as pt with decreased activity tolerance and SaO2 dropping to mid 80s on 6 L [place]; needing minA to steady pt during marching as some lateral instability Ambulation Distance (Feet): 0 Feet  Posture/Postural Control Posture/Postural Control: No significant limitations Balance Balance Assessed: Yes Dynamic Standing Balance Dynamic Standing - Balance Support: No upper extremity supported Dynamic Standing - Level of Assistance: 4: Min assist Dynamic Standing - Comments: during pre-gait activity pt needing minA for lateral support to remain steady Exercise  Total Joint Exercises Ankle Circles/Pumps: AROM;Both;5 reps;Supine End of Session PT - End of Session Equipment Utilized During Treatment: Gait belt Activity Tolerance: Patient limited by fatigue;Treatment limited secondary to medical complications (Comment) Patient left: in bed;with call bell in reach;with family/visitor present Nurse Communication: Mobility status for transfers General Behavior During Session: [facility] for tasks performed Cognition: [facility] for tasks performed  [facility] HELEN 05/30/2011, 11:10 AM

## 2011-05-31 ENCOUNTER — Inpatient Hospital Stay (HOSPITAL_COMMUNITY): Payer: Medicare Other

## 2011-05-31 LAB — COMPREHENSIVE METABOLIC PANEL
AST: 27 U/L (ref 0–37)
Albumin: 2.2 g/dL — ABNORMAL LOW (ref 3.5–5.2)
BUN: 18 mg/dL (ref 6–23)
Calcium: 8.5 mg/dL (ref 8.4–10.5)
Chloride: 100 mEq/L (ref 96–112)
Creatinine, Ser: 0.55 mg/dL (ref 0.50–1.10)
GFR calc non Af Amer: 85 mL/min — ABNORMAL LOW (ref >90)
Total Bilirubin: 0.3 mg/dL (ref 0.3–1.2)

## 2011-05-31 LAB — CBC
HCT: 35.7 % — ABNORMAL LOW (ref 36.0–46.0)
MCH: 28.3 pg (ref 26.0–34.0)
MCV: 87.9 fL (ref 78.0–100.0)
Platelets: 265 10*3/uL (ref 150–400)
RDW: 13.9 % (ref 11.5–15.5)

## 2011-05-31 LAB — PROTIME-INR
INR: 1.09 (ref 0.00–1.49)
Prothrombin Time: 14.3 seconds (ref 11.6–15.2)

## 2011-05-31 MED ORDER — FUROSEMIDE 10 MG/ML IJ SOLN
40.0000 mg | Freq: Every day | INTRAMUSCULAR | Status: DC
  Administered 2011-05-31 – 2011-06-01 (×2): 40 mg via INTRAVENOUS
  Filled 2011-05-31 (×2): qty 4

## 2011-05-31 MED ORDER — WARFARIN SODIUM 5 MG PO TABS
5.0000 mg | ORAL_TABLET | Freq: Once | ORAL | Status: AC
  Administered 2011-05-31: 5 mg via ORAL
  Filled 2011-05-31: qty 1

## 2011-05-31 MED ORDER — VANCOMYCIN HCL 500 MG IV SOLR
500.0000 mg | INTRAVENOUS | Status: DC
  Administered 2011-05-31: 500 mg via INTRAVENOUS
  Filled 2011-05-31 (×2): qty 500

## 2011-05-31 NOTE — Progress Notes (Signed)
ANTIBIOTIC CONSULT NOTE - INITIAL  Pharmacy Consult for Vancomycin  Indication: pneumonia  Assessment: Michelle Hopkins is a 48 yof with multilobar pna currently on day #5 of primaxin to be started on vancomycin for additional gram positive coverage. Pt is small (44kg) and estimated crcl 37. Likely that renal function is poorer than expected d/t pts age and small frame.  UOP is good. Pt is afebrile and WBC WNL. No cultures drawn.   Goal of Therapy:  Vancomycin trough level 15-20 mcg/ml  Plan:  1. Vancomycin 500mg  IV q24 hours  2. F/u pt progression, LOT, troughs, and microbiology  Thank you,  Brett Fairy, PharmD Pager: [phone]  05/31/2011 9:54 AM    Allergies  Allergen Reactions  . Avelox (Moxifloxacin Hcl In Nacl)   . Canarium     Unknown reaction  . Codeine   . Latex     Rash   . Neosporin (Neomycin-Polymyxin B)   . Rice   . Tape     Rips skin    Patient Measurements: Height: 5\' 2"  (157.5 cm) Weight: 97 lb (44 kg) IBW/kg (Calculated) : 50.1   Vital Signs: Temp: 98 F (36.7 C) (01/06 0752) Temp src: Oral (01/06 0752) BP: 109/56 mmHg (01/06 0900) Pulse Rate: 83  (01/06 0900) Intake/Output from previous day: 01/05 0701 - 01/06 0700 In: 1987 [P.O.:837; I.V.:1050; IV Piggyback:100] Out: 2150 [Urine:2150] Intake/Output from this shift: Total I/O In: 300 [P.O.:240; I.V.:60] Out: -   Labs:  Basename 05/31/11 0545 05/30/11 0600 05/29/11 0630  WBC 10.3 8.6 11.1*  HGB 11.5* 11.3* 11.4*  PLT 265 241 212  LABCREA -- -- --  CREATININE 0.55 0.50 0.51   Estimated Creatinine Clearance: 37.7 ml/min (by C-G formula based on Cr of 0.55). No results found for this basename: VANCOTROUGH:2,VANCOPEAK:2,VANCORANDOM:2,GENTTROUGH:2,GENTPEAK:2,GENTRANDOM:2,TOBRATROUGH:2,TOBRAPEAK:2,TOBRARND:2,AMIKACINPEAK:2,AMIKACINTROU:2,AMIKACIN:2, in the last 72 hours   Microbiology: Recent Results (from the past 720 hour(s))  MRSA PCR SCREENING     Status: Normal   Collection Time   05/27/11  6:55 PM      Component Value Range Status Comment   MRSA by PCR NEGATIVE  NEGATIVE  Final     Medical History: Past Medical History  Diagnosis Date  . COPD (chronic obstructive pulmonary disease)   . HTN (hypertension)   . Hypothyroidism   . Osteoporosis   . Postherpetic neuralgia     Medications:  Scheduled:    . antiseptic oral rinse  15 mL Mouth Rinse BID  . diltiazem  180 mg Oral Daily  . enoxaparin (LOVENOX) injection  40 mg Subcutaneous Q12H  . feeding supplement  237 mL Oral BID BM  . Fluticasone-Salmeterol  1 puff Inhalation BID  . furosemide  40 mg Intravenous Once  . furosemide  40 mg Intravenous Daily  . gabapentin  300 mg Oral TID  . imipenem-cilastatin  250 mg Intravenous Q12H  . levalbuterol  0.63 mg Nebulization Q6H  . levothyroxine  88 mcg Oral QAC breakfast  . mirtazapine  15 mg Oral QHS  . pantoprazole  40 mg Oral Q1200  . tiotropium  18 mcg Inhalation Daily  . warfarin  4 mg Oral ONCE-1800  . warfarin   Does not apply Once  . DISCONTD: imipenem-cilastatin  500 mg Intravenous Q12H   Infusions:    . sodium chloride 10 mL (05/31/11 0839)   PRN: DISCONTD: albuterol  Bufford Buttner, Afnan Emberton N 05/31/2011,9:46 AM

## 2011-05-31 NOTE — Progress Notes (Signed)
ANTICOAGULATION CONSULT NOTE - Follow Up Consult  Pharmacy Consult for Lovenox/Coumadin  Indication: atrial fibrillation  Assessment: Michelle Hopkins is a 7 yof with atrial fibrillation on full dose lovenox and coumadin. INR today 1.09. No bleeding noted.   Goal of Therapy:  INR 2-2.5 (lower therapeutic range d/t age and small frame) Full anticoagulation   Plan:  1. Continue lovenox at 40mg  sq q12 h (no change) 2. Give warfarin 5mg  x 1 today  3. F/u PT/INR in AM  Thank you,  Brett Fairy, PharmD Pager: [phone]  05/31/2011 10:01 AM   Allergies  Allergen Reactions  . Avelox (Moxifloxacin Hcl In Nacl)   . Canarium     Unknown reaction  . Codeine   . Latex     Rash   . Neosporin (Neomycin-Polymyxin B)   . Rice   . Tape     Rips skin    Patient Measurements: Height: 5\' 2"  (157.5 cm) Weight: 97 lb (44 kg) IBW/kg (Calculated) : 50.1   Vital Signs: Temp: 98 F (36.7 C) (01/06 0752) Temp src: Oral (01/06 0752) BP: 109/56 mmHg (01/06 0900) Pulse Rate: 83  (01/06 0900)  Labs:  Basename 05/31/11 0545 05/30/11 0600 05/29/11 0630  HGB 11.5* 11.3* --  HCT 35.7* 35.5* 35.3*  PLT 265 241 212  APTT -- -- --  LABPROT 14.3 14.0 --  INR 1.09 1.06 --  HEPARINUNFRC -- -- --  CREATININE 0.55 0.50 0.51  CKTOTAL -- -- --  CKMB -- -- --  TROPONINI -- -- --   Estimated Creatinine Clearance: 37.7 ml/min (by C-G formula based on Cr of 0.55).  Medications:  Scheduled:    . antiseptic oral rinse  15 mL Mouth Rinse BID  . diltiazem  180 mg Oral Daily  . enoxaparin (LOVENOX) injection  40 mg Subcutaneous Q12H  . feeding supplement  237 mL Oral BID BM  . Fluticasone-Salmeterol  1 puff Inhalation BID  . furosemide  40 mg Intravenous Once  . furosemide  40 mg Intravenous Daily  . gabapentin  300 mg Oral TID  . imipenem-cilastatin  250 mg Intravenous Q12H  . levalbuterol  0.63 mg Nebulization Q6H  . levothyroxine  88 mcg Oral QAC breakfast  . mirtazapine  15 mg Oral QHS  .  pantoprazole  40 mg Oral Q1200  . tiotropium  18 mcg Inhalation Daily  . vancomycin  500 mg Intravenous Q24H  . warfarin  4 mg Oral ONCE-1800  . warfarin   Does not apply Once  . DISCONTD: imipenem-cilastatin  500 mg Intravenous Q12H   Infusions:    . sodium chloride 10 mL (05/31/11 0839)   PRN: DISCONTD: albuterol  Michelle Hopkins, Michelle Hopkins 05/31/2011,9:55 AM

## 2011-05-31 NOTE — Progress Notes (Signed)
  Patient Name: Michelle Hopkins      SUBJECTIVE:   Past Medical History  Diagnosis Date  . COPD (chronic obstructive pulmonary disease)   . HTN (hypertension)   . Hypothyroidism   . Osteoporosis   . Postherpetic neuralgia     PHYSICAL EXAM Filed Vitals:   05/31/11 1000 05/31/11 1100 05/31/11 1140 05/31/11 1200  BP: 116/63 128/71 102/64 104/68  Pulse: 95 99 98 61  Temp:   98.4 F (36.9 C)   TempSrc:   Oral   Resp: 15 22 23 23   Height:      Weight:      SpO2: 92% 94% 95% 93%    Well developed and nourished in moderate respiratory distress  HENT normal Neck supple with JVP-flat Carotids brisk and full without bruits Wheezes and rhonchi bilaterally decreased breath sounds left Irregularly irregular rate and rhythm with rapid   ventricular response, no murmurs or gallops Abd-soft with active BS without hepatomegaly No Clubbing cyanosis edema Skin-warm and dry A & Oriented  Grossly normal sensory and motor function  TELEMETRY: Reviewed telemetry pt in af with  VR now in the 110s  Intake/Output Summary (Last 24 hours) at 05/31/11 1338 Last data filed at 05/31/11 1300  Gross per 24 hour  Intake   1637 ml  Output   3900 ml  Net  -2263 ml   Its not clear whethr teh above are right or wrong !!!!! LABS: Basic Metabolic Panel:  Lab 05/31/11 1610 05/30/11 0600 05/29/11 0630 05/28/11 0330 05/27/11 1735  NA 141 140 137 136 135  K 3.8 4.2 3.9 4.1 3.7  CL 100 105 105 103 97  CO2 34* 30 27 29 27   GLUCOSE 94 96 101* 113* 128*  BUN 18 19 19 22  27*  CREATININE 0.55 0.50 0.51 0.60 0.60  CALCIUM 8.5 8.5 -- -- --  MG -- -- -- -- --  PHOS -- -- -- -- --   Cardiac Enzymes: No results found for this basename: CKTOTAL:3,CKMB:3,CKMBINDEX:3,TROPONINI:3 in the last 72 hours CBC:  Lab 05/31/11 0545 05/30/11 0600 05/29/11 0630 05/28/11 0330 05/27/11 1735  WBC 10.3 8.6 11.1* 12.8* 18.3*  NEUTROABS -- -- -- -- --  HGB 11.5* 11.3* 11.4* 11.3* 12.6  HCT 35.7* 35.5* 35.3* 34.9* 37.7    MCV 87.9 89.2 87.6 86.8 85.7  PLT 265 241 212 203 220   PROTIME:  Basename 05/31/11 0545 05/30/11 0600  LABPROT 14.3 14.0  INR 1.09 1.06   Liver Function Tests:  Basename 05/31/11 0545 05/30/11 0600  AST 27 39*  ALT 38* 51*  ALKPHOS 165* 184*  BILITOT 0.3 0.3  PROT 5.3* 5.3*  ALBUMIN 2.2* 2.2*   BNP 2760  ASSESSMENT AND PLAN:  Patient Active Hospital Problem List: Pneumonia (05/27/2011)    Atrial fibrillation (05/27/2011)   Elevated transaminase level (05/29/2011)     She continues to have atrial fibrillation in the setting of her pneumonia with a rapid but improved ventricular response and I would not be surprised if there is a component of diastolic failure contributing to her shortness of breath.  Is there a role for respiratory therapy to help with consolidation  BNP elevated will again treat with low-dose Lasix. UO good yesterday and Bun Cr ok  AF HR a little bit better controlled    Signed, Sherryl Manges MD  05/31/2011

## 2011-05-31 NOTE — Progress Notes (Signed)
Subjective: Still not breathing great.  Was given Lasix last night, had good UOP, but minimal clinical response at this time.  Her CXR looks unchanged from previous.  She notes no new issues  Objective: Vital signs in last 24 hours: Temp:  [97.7 F (36.5 C)-99 F (37.2 C)] 98 F (36.7 C) (01/06 0752) Pulse Rate:  [84-94] 92  (01/06 0752) Resp:  [17-26] 17  (01/06 0752) BP: (85-128)/(54-84) 128/73 mmHg (01/06 0752) SpO2:  [85 %-98 %] 92 % (01/06 0829) Weight:  [97 lb (44 kg)] 97 lb (44 kg) (01/06 0500) Weight change: -1 lb 8.7 oz (-0.7 kg) Last BM Date: 05/27/11  Intake/Output from previous day: 01/05 0701 - 01/06 0700 In: 1937 [P.O.:837; I.V.:1000; IV Piggyback:100] Out: 2150 [Urine:2150] Intake/Output this shift:   General appearance: alert, cooperative, appears stated age and no distress  Resp: diminished breath sounds bibasilar  Chest wall: no tenderness  Cardio: irregularly irregular rhythm  GI: soft, non-tender; bowel sounds normal; no masses, no organomegaly  Extremities: extremities normal, atraumatic, no cyanosis or edema  Skin: Mild medial R forearm bruise improving   Lab Results:  Basename 05/31/11 0545 05/30/11 0600  WBC 10.3 8.6  HGB 11.5* 11.3*  HCT 35.7* 35.5*  PLT 265 241   BMET  Basename 05/31/11 0545 05/30/11 0600  NA 141 140  K 3.8 4.2  CL 100 105  CO2 34* 30  GLUCOSE 94 96  BUN 18 19  CREATININE 0.55 0.50  CALCIUM 8.5 8.5    Studies/Results: Dg Chest Port 1 View  05/31/2011  *RADIOLOGY REPORT*  Clinical Data: Pneumonia  PORTABLE CHEST - 1 VIEW  Comparison: 05/29/2011  Findings: Background COPD/emphysema noted.  Stable bibasilar airspace disease and left lower lobe dense consolidation. Persistent left greater than right effusion.  No change in upper lobe aeration.  No pneumothorax.  Trachea is midline.  Degenerative changes of the spine with an associated scoliosis.  IMPRESSION: No significant change in bibasilar pneumonia, left lower lobe  consolidation, and pleural effusions.  Original Report Authenticated By: Judie Petit. Ruel Favors, M.D.    Medications:  I have reviewed the patient's current medications. Scheduled:   . antiseptic oral rinse  15 mL Mouth Rinse BID  . diltiazem  180 mg Oral Daily  . enoxaparin (LOVENOX) injection  40 mg Subcutaneous Q12H  . feeding supplement  237 mL Oral BID BM  . Fluticasone-Salmeterol  1 puff Inhalation BID  . furosemide  40 mg Intravenous Once  . furosemide  40 mg Intravenous Daily  . gabapentin  300 mg Oral TID  . imipenem-cilastatin  250 mg Intravenous Q12H  . levalbuterol  0.63 mg Nebulization Q6H  . levothyroxine  88 mcg Oral QAC breakfast  . mirtazapine  15 mg Oral QHS  . pantoprazole  40 mg Oral Q1200  . tiotropium  18 mcg Inhalation Daily  . warfarin  4 mg Oral ONCE-1800  . warfarin   Does not apply Once  . DISCONTD: imipenem-cilastatin  500 mg Intravenous Q12H   Continuous:   . sodium chloride 50 mL/hr at 05/30/11 0351   EAV:[id number]: albuterol  Assessment/Plan: 1) Multilobar Pneumonia. Continue Primaxin. Adding Vancomycin today.  The CT chest did not show indication of malignancy. CXR pending again for tomorrow AM Will keep in Stepdown another day. If no improvement, will get Pulm consult.  Contine Xopenex, but will put on Advair as well. 2) Afib. TSH normal, most likely related to her respiratory illness. Cards following with an Echo showing normal LVEF, but  moderate MR and TR with higher R sided pressures.. Oral Cardizem CD and subq Lovenox for ease of administration. Coumadin per Cards recommendation. We will be able to monitor as outpt through our office's coumadin clinic. Lasix for diastolic dysfunction 3) Osteoporosis: As above.  4) Weight loss. Daughter has primarily noted decreased appetite in the past couple of weeks due to her illness. Might be a manifestation of her having the PNA. Nutrition consult + Ensure added. Son told to bring in outside food.  5) HTN  Controlled, Cardizem as well.  6) WBCNormalized 7) Herpetic neuralgia: Continue Neurontin.  8) PT/OT as capable in step down, incentive spirometer.   LOS: 4 days   Michelle Hopkins W 05/31/2011, 8:37 AM

## 2011-06-01 DIAGNOSIS — I4891 Unspecified atrial fibrillation: Secondary | ICD-10-CM

## 2011-06-01 DIAGNOSIS — I5033 Acute on chronic diastolic (congestive) heart failure: Secondary | ICD-10-CM

## 2011-06-01 DIAGNOSIS — R0602 Shortness of breath: Secondary | ICD-10-CM

## 2011-06-01 DIAGNOSIS — J13 Pneumonia due to Streptococcus pneumoniae: Secondary | ICD-10-CM

## 2011-06-01 DIAGNOSIS — J449 Chronic obstructive pulmonary disease, unspecified: Secondary | ICD-10-CM

## 2011-06-01 LAB — COMPREHENSIVE METABOLIC PANEL
ALT: 29 U/L (ref 0–35)
AST: 19 U/L (ref 0–37)
Albumin: 2.1 g/dL — ABNORMAL LOW (ref 3.5–5.2)
Alkaline Phosphatase: 144 U/L — ABNORMAL HIGH (ref 39–117)
Chloride: 96 mEq/L (ref 96–112)
Potassium: 3.7 mEq/L (ref 3.5–5.1)
Sodium: 138 mEq/L (ref 135–145)
Total Bilirubin: 0.2 mg/dL — ABNORMAL LOW (ref 0.3–1.2)
Total Protein: 5.2 g/dL — ABNORMAL LOW (ref 6.0–8.3)

## 2011-06-01 LAB — PRO B NATRIURETIC PEPTIDE: Pro B Natriuretic peptide (BNP): 2354 pg/mL — ABNORMAL HIGH (ref 0–450)

## 2011-06-01 LAB — INFLUENZA PANEL BY PCR (TYPE A & B)
H1N1 flu by pcr: NOT DETECTED
Influenza B By PCR: NEGATIVE

## 2011-06-01 LAB — CBC
HCT: 37.3 % (ref 36.0–46.0)
Hemoglobin: 11.9 g/dL — ABNORMAL LOW (ref 12.0–15.0)
MCHC: 31.9 g/dL (ref 30.0–36.0)
RDW: 13.9 % (ref 11.5–15.5)
WBC: 9.3 10*3/uL (ref 4.0–10.5)

## 2011-06-01 LAB — STREP PNEUMONIAE URINARY ANTIGEN: Strep Pneumo Urinary Antigen: NEGATIVE

## 2011-06-01 LAB — D-DIMER, QUANTITATIVE: D-Dimer, Quant: 1.84 ug/mL-FEU — ABNORMAL HIGH (ref 0.00–0.48)

## 2011-06-01 MED ORDER — POTASSIUM CHLORIDE 20 MEQ/15ML (10%) PO LIQD
20.0000 meq | Freq: Three times a day (TID) | ORAL | Status: AC
  Administered 2011-06-01 – 2011-06-02 (×4): 20 meq via ORAL
  Filled 2011-06-01 (×4): qty 15

## 2011-06-01 MED ORDER — FUROSEMIDE 10 MG/ML IJ SOLN
10.0000 mg | Freq: Three times a day (TID) | INTRAMUSCULAR | Status: DC
  Administered 2011-06-01 – 2011-06-02 (×2): 10 mg via INTRAVENOUS
  Filled 2011-06-01 (×4): qty 1

## 2011-06-01 MED ORDER — DEXTROSE 5 % IV SOLN
1.0000 g | INTRAVENOUS | Status: DC
  Administered 2011-06-01 – 2011-06-02 (×2): 1 g via INTRAVENOUS
  Filled 2011-06-01 (×3): qty 10

## 2011-06-01 MED ORDER — WARFARIN SODIUM 4 MG PO TABS
4.0000 mg | ORAL_TABLET | Freq: Once | ORAL | Status: AC
  Administered 2011-06-01: 4 mg via ORAL
  Filled 2011-06-01: qty 1

## 2011-06-01 MED ORDER — PREDNISONE 20 MG PO TABS
20.0000 mg | ORAL_TABLET | Freq: Every day | ORAL | Status: DC
  Administered 2011-06-01 – 2011-06-03 (×3): 20 mg via ORAL
  Filled 2011-06-01 (×5): qty 1

## 2011-06-01 MED ORDER — DEXTROSE 5 % IV SOLN
500.0000 mg | INTRAVENOUS | Status: DC
  Administered 2011-06-01 – 2011-06-02 (×2): 500 mg via INTRAVENOUS
  Filled 2011-06-01 (×3): qty 500

## 2011-06-01 NOTE — Progress Notes (Signed)
PT Cancellation Note  Treatment cancelled today due to patient's refusal to participate. Pt reporting she is too SOB at this time and requests PT at a later time.   [facility] HELEN 06/01/2011, 12:15 PM

## 2011-06-01 NOTE — Progress Notes (Signed)
*  PRELIMINARY RESULTS* Bilateral lower extremity venous duplex completed. No evidence of DVT,suprficial thrombosis, or Baker's cyst.  Michelle Hopkins, [place] D 06/01/2011, 1:14 PM

## 2011-06-01 NOTE — Progress Notes (Signed)
  Pharmacy Consult for Lovenox/Coumadin/Vancomycin Indication: atrial fibrillation/pneumonia  Assessment: Michelle Hopkins is a 86 yof with atrial fibrillation on full dose lovenox and coumadin. INR today 1.44. No bleeding noted. Vancomycin added to Primaxin yesterday for pneumonia.  Goal of Therapy:  INR 2-2.5 (lower therapeutic range d/t age and small frame) Full anticoagulation Vancomycin trough 15-20mg    Plan:  1. Continue lovenox at 40mg  sq q12 h (no change) 2. Give warfarin 4mg  x 1 today  3. F/u PT/INR in AM 4. Continue Vancomycin 500mg  IV q24.  Celedonio Miyamoto, PharmD, BCPS Infectious Disease Pharmacist Pager [phone]    Allergies  Allergen Reactions  . Avelox (Moxifloxacin Hcl In Nacl)   . Canarium     Unknown reaction  . Codeine   . Latex     Rash   . Neosporin (Neomycin-Polymyxin B)   . Rice   . Tape     Rips skin    Patient Measurements: Height: 5\' 2"  (157.5 cm) Weight: 97 lb (44 kg) IBW/kg (Calculated) : 50.1   Vital Signs: Temp: 98.8 F (37.1 C) (01/07 0749) Temp src: Oral (01/07 0749) BP: 105/66 mmHg (01/07 0749) Pulse Rate: 91  (01/07 0749)  Labs:  Basename 06/01/11 0717 05/31/11 0545 05/30/11 0600  HGB 11.9* 11.5* --  HCT 37.3 35.7* 35.5*  PLT 272 265 241  APTT -- -- --  LABPROT 17.8* 14.3 14.0  INR 1.44 1.09 1.06  HEPARINUNFRC -- -- --  CREATININE -- 0.55 0.50  CKTOTAL -- -- --  CKMB -- -- --  TROPONINI -- -- --   Estimated Creatinine Clearance: 37.7 ml/min (by C-G formula based on Cr of 0.55).  Medications:  Scheduled:     . antiseptic oral rinse  15 mL Mouth Rinse BID  . diltiazem  180 mg Oral Daily  . enoxaparin (LOVENOX) injection  40 mg Subcutaneous Q12H  . feeding supplement  237 mL Oral BID BM  . Fluticasone-Salmeterol  1 puff Inhalation BID  . furosemide  40 mg Intravenous Daily  . gabapentin  300 mg Oral TID  . imipenem-cilastatin  250 mg Intravenous Q12H  . levalbuterol  0.63 mg Nebulization Q6H  . levothyroxine  88  mcg Oral QAC breakfast  . mirtazapine  15 mg Oral QHS  . pantoprazole  40 mg Oral Q1200  . predniSONE  20 mg Oral Q breakfast  . tiotropium  18 mcg Inhalation Daily  . vancomycin  500 mg Intravenous Q24H  . warfarin  5 mg Oral ONCE-1800   Infusions:     . sodium chloride 10 mL (05/31/11 0839)   PRN:   Mickeal Skinner 06/01/2011,8:40 AM

## 2011-06-01 NOTE — Progress Notes (Signed)
Patient Name: Michelle Hopkins      SUBJECTIVE: feelling better   Past Medical History  Diagnosis Date  . COPD (chronic obstructive pulmonary disease)   . HTN (hypertension)   . Hypothyroidism   . Osteoporosis   . Postherpetic neuralgia     PHYSICAL EXAM Filed Vitals:   06/01/11 0016 06/01/11 0240 06/01/11 0400 06/01/11 0500  BP: 99/66  101/60   Pulse: 87  77   Temp: 97.5 F (36.4 C)  97.9 F (36.6 C)   TempSrc: Oral  Oral   Resp: 20  15   Height:      Weight:    97 lb (44 kg)  SpO2: 98% 100% 100%   BP 101/60  Pulse 77  Temp(Src) 97.9 F (36.6 C) (Oral)  Resp 15  Ht 5\' 2"  (1.575 m)  Wt 97 lb (44 kg)  BMI 17.74 kg/m2  SpO2 100%  Temp (24hrs), Avg:98.2 F (36.8 C), Min:97.5 F (36.4 C), Max:99.5 F (37.5 C)    Well developed and nourished in moderate respiratory distress  HENT normal Neck supple with JVP-flat Carotids brisk and full without bruits Wheezes and rhonchi bilaterally decreased breath sounds left Irregularly irregular rate and rhythm with rapid   ventricular response, no murmurs or gallops Abd-soft with active BS without hepatomegaly No Clubbing cyanosis edema Skin-warm and dry A & Oriented  Grossly normal sensory and motor function  TELEMETRY: Reviewed telemetry pt in af with  VR now in the 110s  Intake/Output Summary (Last 24 hours) at 06/01/11 0742 Last data filed at 06/01/11 0500  Gross per 24 hour  Intake   1294 ml  Output   2300 ml  Net  -1006 ml   Its not clear whethr teh above are right or wrong !!!!! LABS: Basic Metabolic Panel:  Lab 05/31/11 1610 05/30/11 0600 05/29/11 0630 05/28/11 0330 05/27/11 1735  NA 141 140 137 136 135  K 3.8 4.2 3.9 4.1 3.7  CL 100 105 105 103 97  CO2 34* 30 27 29 27   GLUCOSE 94 96 101* 113* 128*  BUN 18 19 19 22  27*  CREATININE 0.55 0.50 0.51 0.60 0.60  CALCIUM 8.5 8.5 -- -- --  MG -- -- -- -- --  PHOS -- -- -- -- --   Cardiac Enzymes: No results found for this basename:  CKTOTAL:3,CKMB:3,CKMBINDEX:3,TROPONINI:3 in the last 72 hours CBC:  Lab 05/31/11 0545 05/30/11 0600 05/29/11 0630 05/28/11 0330 05/27/11 1735  WBC 10.3 8.6 11.1* 12.8* 18.3*  NEUTROABS -- -- -- -- --  HGB 11.5* 11.3* 11.4* 11.3* 12.6  HCT 35.7* 35.5* 35.3* 34.9* 37.7  MCV 87.9 89.2 87.6 86.8 85.7  PLT 265 241 212 203 220   PROTIME:  Basename 05/31/11 0545 05/30/11 0600  LABPROT 14.3 14.0  INR 1.09 1.06   Liver Function Tests:  Basename 05/31/11 0545 05/30/11 0600  AST 27 39*  ALT 38* 51*  ALKPHOS 165* 184*  BILITOT 0.3 0.3  PROT 5.3* 5.3*  ALBUMIN 2.2* 2.2*   BNP 2760  ASSESSMENT AND PLAN:  Patient Active Hospital Problem List: Pneumonia (05/27/2011)    Atrial fibrillation (05/27/2011)  CHF   Elevated transaminase level (05/29/2011)     She continues to have atrial fibrillation in the setting of her pneumonia with a rapid but improved ventricular response i think there is a component of HFPEF   willcontinue low dose diuretics as long as Bun/Cr ok ?? Is there a role for respiratory therapy to help with consolidation  AF HR better controlled mean 90  From AF perspective she is on coumadin   (per pharmacy so NOT on MAR)   Signed, Sherryl Manges MD  06/01/2011

## 2011-06-01 NOTE — Progress Notes (Signed)
Subjective: Doing ok, no better, no worse,  sats still drop with talking, low 90's, upper 80's on 6L.  Objective: Vital signs in last 24 hours: Temp:  [97.5 F (36.4 C)-99.5 F (37.5 C)] 98.8 F (37.1 C) (01/07 0749) Pulse Rate:  [61-108] 91  (01/07 0749) Resp:  [14-24] 19  (01/07 0749) BP: (98-132)/(54-71) 105/66 mmHg (01/07 0749) SpO2:  [90 %-100 %] 94 % (01/07 0749) Weight:  [97 lb (44 kg)] 97 lb (44 kg) (01/07 0500) Weight change: 0 lb (0 kg) Last BM Date: 05/27/11  Intake/Output from previous day: 01/06 0701 - 01/07 0700 In: 1294 [P.O.:834; I.V.:260; IV Piggyback:200] Out: 2300 [Urine:2300] Intake/Output this shift:   General appearance: alert, cooperative, appears stated age and no distress  Resp: diminished breath sounds bibasilar  Chest wall: no tenderness  Cardio: irregularly irregular rhythm  GI: soft, non-tender; bowel sounds normal; no masses, no organomegaly  Extremities: extremities normal, atraumatic, no cyanosis or edema    Lab Results:  Basename 05/31/11 0545 05/30/11 0600  WBC 10.3 8.6  HGB 11.5* 11.3*  HCT 35.7* 35.5*  PLT 265 241   BMET  Basename 05/31/11 0545 05/30/11 0600  NA 141 140  K 3.8 4.2  CL 100 105  CO2 34* 30  GLUCOSE 94 96  BUN 18 19  CREATININE 0.55 0.50  CALCIUM 8.5 8.5    Studies/Results: Dg Chest Port 1 View  05/31/2011  *RADIOLOGY REPORT*  Clinical Data: Pneumonia  PORTABLE CHEST - 1 VIEW  Comparison: 05/29/2011  Findings: Background COPD/emphysema noted.  Stable bibasilar airspace disease and left lower lobe dense consolidation. Persistent left greater than right effusion.  No change in upper lobe aeration.  No pneumothorax.  Trachea is midline.  Degenerative changes of the spine with an associated scoliosis.  IMPRESSION: No significant change in bibasilar pneumonia, left lower lobe consolidation, and pleural effusions.  Original Report Authenticated By: Judie Petit. Ruel Favors, M.D.    Medications:  I have reviewed the patient's  current medications. Scheduled:   . antiseptic oral rinse  15 mL Mouth Rinse BID  . diltiazem  180 mg Oral Daily  . enoxaparin (LOVENOX) injection  40 mg Subcutaneous Q12H  . feeding supplement  237 mL Oral BID BM  . Fluticasone-Salmeterol  1 puff Inhalation BID  . furosemide  40 mg Intravenous Daily  . gabapentin  300 mg Oral TID  . imipenem-cilastatin  250 mg Intravenous Q12H  . levalbuterol  0.63 mg Nebulization Q6H  . levothyroxine  88 mcg Oral QAC breakfast  . mirtazapine  15 mg Oral QHS  . pantoprazole  40 mg Oral Q1200  . tiotropium  18 mcg Inhalation Daily  . vancomycin  500 mg Intravenous Q24H  . warfarin  5 mg Oral ONCE-1800   Continuous:   . sodium chloride 10 mL (05/31/11 0839)   PRN:  Assessment/Plan: 1) Multilobar Pneumonia. Continue Primaxin. Adding Vancomycin today. The CT chest did not show indication of malignancy. Continuing inhalers, nebs, Vanc added yesterday.  Will get Pulmonary to formally consult as we don't seem to be progressing, to see if they have anything that they can add to her care. 2) Afib. TSH normal, most likely related to her respiratory illness. Cards following with an Echo showing normal LVEF, but moderate MR and TR with higher R sided pressures.. Oral Cardizem CD and subq Lovenox for ease of administration. Coumadin per Cards recommendation.Continuing Lasix.  Labs still pending this AM 3) Osteoporosis: As above.  4) Weight loss. Eating well at  this point, added supplements earlier in her hospitalization.5) HTN Controlled, Cardizem as well.  6) WBC  Normalized  7) Herpetic neuralgia: Continue Neurontin.  8) PT/OT as capable in step down, incentive spirometer.   LOS: 5 days   Majesti Gambrell W 06/01/2011, 8:01 AM

## 2011-06-01 NOTE — Consult Note (Signed)
Name: Michelle Hopkins MRN: [id number] DOB: [birth year]    LOS: 5  [facility]  History of Present Illness:  78 year old female presented to PCP on 1/2 with CC: 2-3 weeks of weakness and fatigue. Dx eval demonstrated AF w/ RVR, and abnormal CXR (bilateral infiltrates). She was admitted for further eval. RX included broad spectrum antibiotics, oxygen and cards consult for RVR . On 1/7 she was noted to have progressive bilateral airspace disease and effusions. Pulm asked to evaluate.   Lines / Drains:   Cultures: MRSA 1/2: negative Urine strep antigen 1/7>>> Urine legionella antigen 1/7>>> Procalcitonin 1/7>>>  Antibiotics: Imipenem (PNA) 1/2>>>1/7 vanc (progressive PNA) 1/6>>>1/7 Azithromycin 1/7>>> ceftraixne 1/7>>>   Tests / Events: Echo: 1/3: Left ventricle: The cavity size was normal. Wall thickness was normal. Systolic function was normal. The estimated ejection fraction was in the range of 55% to 60%. Wall motion was normal; there were no regional wall motion abnormalities. The study is not technically sufficient to allow evaluation of LV diastolic function.- Mitral valve: Moderate regurgitation- Left atrium: The atrium was moderately dilated. - Right ventricle: The cavity size was mildly dilated.- Right atrium: The atrium was moderately dilated.- Tricuspid valve: Severe regurgitation. - Pulmonary arteries: Systolic pressure was severely increased. ESR 1/7>>> BNP 1/7>>>  HPI   78 year old female presented to PCP on 1/2 with CC: 2-3 weeks of weakness and fatigue. Denied sick exposure, nasal congestion, sore throat, head ache, denied cough, fever, chills, chest pain or wheeze. Denied swallowing difficulty, denied LE swelling. Reports she did have her influenza vaccine this year.  Dx eval demonstrated AF w/ RVR, and abnormal CXR (bilateral infiltrates). She was admitted for further eval. RX included broad spectrum antibiotics, oxygen and cards consult for RVR . On  1/7 she was noted to have progressive bilateral airspace disease and effusions. Pulm asked to evaluate.   Past Medical History  Diagnosis Date  . COPD (chronic obstructive pulmonary disease)   . HTN (hypertension)   . Hypothyroidism   . Osteoporosis   . Postherpetic neuralgia    Past Surgical History  Procedure Date  . Appendectomy 1996  . Left hip repair s/p fracture 2004   Prior to Admission medications   Medication Sig Start Date End Date Taking? Authorizing Provider  beta carotene w/minerals (OCUVITE) tablet Take 1 tablet by mouth daily.     Yes Historical Provider, MD  cefdinir (OMNICEF) 300 MG capsule Take 300 mg by mouth 2 (two) times daily as needed. For shingles    Yes Historical Provider, MD  Fluticasone-Salmeterol (ADVAIR) 250-50 MCG/DOSE AEPB Inhale 1 puff into the lungs daily.     Yes Historical Provider, MD  gabapentin (NEURONTIN) 300 MG capsule Take 300 mg by mouth daily as needed. For nerve pain from shingles    Yes Historical Provider, MD  levothyroxine (SYNTHROID, LEVOTHROID) 88 MCG tablet Take 88 mcg by mouth daily.     Yes Historical Provider, MD  mirtazapine (REMERON) 15 MG tablet Take 15 mg by mouth at bedtime.     Yes Historical Provider, MD  omeprazole (PRILOSEC) 20 MG capsule Take 20 mg by mouth daily.     Yes Historical Provider, MD  tiotropium (SPIRIVA) 18 MCG inhalation capsule Place 18 mcg into inhaler and inhale daily.     Yes Historical Provider, MD   Allergies Allergies  Allergen Reactions  . Avelox (Moxifloxacin Hcl In Nacl)   . Canarium     Unknown reaction  . Codeine   .  Latex     Rash   . Neosporin (Neomycin-Polymyxin B)   . Rice   . Tape     Rips skin    Family History Family History  Problem Relation Age of Onset  . Colon cancer Father   . Huntington's disease Mother   . Coronary artery disease Brother   . Melanoma Sister     Social History  reports that she has quit smoking. Her smoking use included Cigarettes. She has a 20  pack-year smoking history. She does not have any smokeless tobacco history on file. She reports that she does not drink alcohol or use illicit drugs.  Review Of Systems   Review of Systems  Constitutional: No weight loss or gain, no Fevers or chills, marked fatigue fatigue, but now better .  HEENT: No headaches, visual changes, Difficulty swallowing,  or Sore throat, No sneezing, itching, ear ache, nasal congestion, post nasal drip, no visual complaints CV: No chest pain, Orthopnea, PND, swelling in lower extremities, dizziness, palpitations, syncope.  GI has chronic heartburn, indigestion, but no  abdominal pain, nausea, vomiting, diarrhea, change in bowel habits, loss of appetite, bloody stools.  Resp: No cough, No coughing up of blood.No wheezing. Cough productive of yellow tinged sputum since she has been in hospital  Skin: no rash or itching or icterus GU: no dysuria, change in color of urine, no urgency or frequency. No flank pain, no hematuria  MS: No joint pain or swelling. No decreased range of motion  Psych: No change in mood or affect. No depression or anxiety.  Neuro: no difficulty with speech, weakness, numbness, ataxia  Vital Signs: Temp:  [97.5 F (36.4 C)-99.5 F (37.5 C)] 98.8 F (37.1 C) (01/07 0749) Pulse Rate:  [61-108] 91  (01/07 0749) Resp:  [15-24] 19  (01/07 0749) BP: (98-132)/(54-71) 105/66 mmHg (01/07 0749) SpO2:  [90 %-100 %] 94 % (01/07 0749) Weight:  [44 kg (97 lb)] 97 lb (44 kg) (01/07 0500)      . sodium chloride 10 mL (05/31/11 0839)   I/O last 3 completed shifts: In: 1744 [P.O.:834; I.V.:710; IV Piggyback:200] Out: 3150 [Urine:3150]  Physical Examination: General:  Elderly frail female, not in acute distress but does exhibit accessory muscle use while talking normal sentence.  Neuro:  Awake and oriented without focal deficits HEENT:  + JVD at 45 degrees Cardiovascular:  Regular irregular, no MRG Lungs:  Crackles posteriorly bilateral, + accessory  muscle use, no wheeze Abdomen:  NT, Positive BS Musculoskeletal:  No edema, 2 + pulses Skin:  Chronic venous stasis changes both LEs.   Ventilator settings:    Labs and Imaging:   Lab 06/01/11 0717 05/31/11 0545 05/30/11 0600  NA 138 141 140  K 3.7 3.8 4.2  CL 96 100 105  CO2 33* 34* 30  BUN 18 18 19   CREATININE 0.55 0.55 0.50  GLUCOSE 88 94 96    Lab 06/01/11 0717 05/31/11 0545 05/30/11 0600  HGB 11.9* 11.5* 11.3*  HCT 37.3 35.7* 35.5*  WBC 9.3 10.3 8.6  PLT 272 265 241  PCXR: bilateral airspace disease. Aeration worse c/w film on 1/4.   Bedside US: bilateral effusions. Mod size.   Assessment and Plan: Hypoxic respiratory failure in the setting of bilateral pulmonary infiltrates w/ effusions. Superimposed on what appears to be underlying airflow limitations. She does not give a compelling history c/w infection. Suspect that this is primarily volume excess after AF w/ RVR. Inherent diastolic dysfunction  Lab 06/01/11 0717 05/31/11 0545  05/30/11 0600  WBC 9.3 10.3 8.6  No results found for this basename: PROCALCITON:4 in the last 168 hours Erythrocyte Sedimentation Rate  No results found for this basename: esrsedrate   Pro B Natriuretic peptide (BNP)  Date Value Range Status  05/30/2011 2760.0* 0-450 (pg/mL) Final  Rec/plan: -send urine strep and legionella antigen -send influenza PCR -repeat BNP -send Procalcitonin: if negative would have very low threshold for narrow abx -push diuresis at tolerated -will consider thora if distress or increased fevers, clinical declines. Avoid for now INR noted, and developed effusion rapid in hospital likley comparing CT chest findings The CT findings peripheral and rapid effusion does concern me with PE, will doppler legs and use neg pred value d dimer May need repeat CT chest with contrast Echo reviewed, RV chronic from lung dz  ID R/o PNA, r/o noninfectious etiology Esr Dc imi, vanc Add azithro, ceftr Asses flu CT  reviewed Unsure if infectious See above   BABCOCK,PETE 06/01/2011, 9:11 AM  Mcarthur Rossetti. Tyson Alias, MD, FACP Pgr: [phone] [facility]

## 2011-06-02 ENCOUNTER — Inpatient Hospital Stay (HOSPITAL_COMMUNITY): Payer: Medicare Other

## 2011-06-02 DIAGNOSIS — I4891 Unspecified atrial fibrillation: Secondary | ICD-10-CM

## 2011-06-02 DIAGNOSIS — J13 Pneumonia due to Streptococcus pneumoniae: Secondary | ICD-10-CM

## 2011-06-02 DIAGNOSIS — J189 Pneumonia, unspecified organism: Secondary | ICD-10-CM

## 2011-06-02 DIAGNOSIS — J449 Chronic obstructive pulmonary disease, unspecified: Secondary | ICD-10-CM

## 2011-06-02 LAB — COMPREHENSIVE METABOLIC PANEL
ALT: 32 U/L (ref 0–35)
Albumin: 2.1 g/dL — ABNORMAL LOW (ref 3.5–5.2)
Alkaline Phosphatase: 137 U/L — ABNORMAL HIGH (ref 39–117)
Potassium: 4.1 mEq/L (ref 3.5–5.1)
Sodium: 136 mEq/L (ref 135–145)
Total Protein: 5.3 g/dL — ABNORMAL LOW (ref 6.0–8.3)

## 2011-06-02 LAB — CBC
MCHC: 32 g/dL (ref 30.0–36.0)
Platelets: 299 10*3/uL (ref 150–400)
RDW: 13.8 % (ref 11.5–15.5)

## 2011-06-02 LAB — LEGIONELLA ANTIGEN, URINE: Legionella Antigen, Urine: NEGATIVE

## 2011-06-02 MED ORDER — WARFARIN SODIUM 2.5 MG PO TABS
2.5000 mg | ORAL_TABLET | Freq: Once | ORAL | Status: AC
  Administered 2011-06-02: 2.5 mg via ORAL
  Filled 2011-06-02: qty 1

## 2011-06-02 MED ORDER — OMEPRAZOLE 20 MG PO CPDR
20.0000 mg | DELAYED_RELEASE_CAPSULE | Freq: Every day | ORAL | Status: DC
  Administered 2011-06-02: 20 mg via ORAL
  Filled 2011-06-02 (×4): qty 1

## 2011-06-02 MED ORDER — ONDANSETRON HCL 4 MG/2ML IJ SOLN
4.0000 mg | Freq: Four times a day (QID) | INTRAMUSCULAR | Status: DC | PRN
  Administered 2011-06-02 – 2011-06-04 (×3): 4 mg via INTRAVENOUS
  Filled 2011-06-02 (×3): qty 2

## 2011-06-02 MED ORDER — MIRTAZAPINE 15 MG PO TBDP
15.0000 mg | ORAL_TABLET | Freq: Every day | ORAL | Status: AC
  Administered 2011-06-02: 15 mg via ORAL
  Filled 2011-06-02 (×2): qty 1

## 2011-06-02 NOTE — Progress Notes (Signed)
Patient Name: Michelle Hopkins      SUBJECTIVE: feelling better  Antibiotics adjusted per IM Past Medical History  Diagnosis Date  . COPD (chronic obstructive pulmonary disease)   . HTN (hypertension)   . Hypothyroidism   . Osteoporosis   . Postherpetic neuralgia     PHYSICAL EXAM Filed Vitals:   06/02/11 0032 06/02/11 0131 06/02/11 0452 06/02/11 0735  BP: 103/63  103/65 137/66  Pulse: 90  79 83  Temp: 97.6 F (36.4 C)  97.8 F (36.6 C) 97.9 F (36.6 C)  TempSrc: Oral  Oral Oral  Resp: 12   13  Height:      Weight: 89 lb 8.1 oz (40.6 kg)     SpO2: 100% 96% 94% 96%  BP 137/66  Pulse 83  Temp(Src) 97.9 F (36.6 C) (Oral)  Resp 13  Ht 5\' 2"  (1.575 m)  Wt 89 lb 8.1 oz (40.6 kg)  BMI 16.37 kg/m2  SpO2 96%  Temp (24hrs), Avg:98.1 F (36.7 C), Min:97.6 F (36.4 C), Max:99.7 F (37.6 C)    Well developed and nourished in moderate respiratory distress  HENT normal Neck supple with JVP-flat Carotids brisk and full without bruits Wheezes and rhonchi bilaterally decreased breath sounds left Irregularly irregular rate and rhythm with rapid   ventricular response, no murmurs or gallops Abd-soft with active BS without hepatomegaly No Clubbing cyanosis edema Skin-warm and dry A & Oriented  Grossly normal sensory and motor function  TELEMETRY: Reviewed telemetry pt in af with  VR now in the 110s  Intake/Output Summary (Last 24 hours) at 06/02/11 0851 Last data filed at 06/02/11 0800  Gross per 24 hour  Intake    521 ml  Output   3250 ml  Net  -2729 ml     LABS: Basic Metabolic Panel:  Lab 06/02/11 9604 06/01/11 0717 05/31/11 0545 05/30/11 0600 05/29/11 0630 05/28/11 0330 05/27/11 1735  NA 136 138 141 140 137 136 135  K 4.1 3.7 3.8 4.2 3.9 4.1 3.7  CL 94* 96 100 105 105 103 97  CO2 37* 33* 34* 30 27 29 27   GLUCOSE 94 88 94 96 101* 113* 128*  BUN 22 18 18 19 19 22  27*  CREATININE 0.63 0.55 0.55 0.50 0.51 0.60 0.60  CALCIUM 8.4 8.3* -- -- -- -- --  MG -- --  -- -- -- -- --  PHOS -- -- -- -- -- -- --   Cardiac Enzymes: No results found for this basename: CKTOTAL:3,CKMB:3,CKMBINDEX:3,TROPONINI:3 in the last 72 hours CBC:  Lab 06/02/11 0358 06/01/11 0717 05/31/11 0545 05/30/11 0600 05/29/11 0630 05/28/11 0330 05/27/11 1735  WBC 11.6* 9.3 10.3 8.6 11.1* 12.8* 18.3*  NEUTROABS -- -- -- -- -- -- --  HGB 11.9* 11.9* 11.5* 11.3* 11.4* 11.3* 12.6  HCT 37.2 37.3 35.7* 35.5* 35.3* 34.9* 37.7  MCV 86.9 87.1 87.9 89.2 87.6 86.8 85.7  PLT 299 272 265 241 212 203 220   PROTIME:  Basename 06/02/11 0358 06/01/11 0717 05/31/11 0545  LABPROT 22.5* 17.8* 14.3  INR 1.94* 1.44 1.09   Liver Function Tests:  Basename 06/02/11 0358 06/01/11 0717  AST 28 19  ALT 32 29  ALKPHOS 137* 144*  BILITOT 0.2* 0.2*  PROT 5.3* 5.2*  ALBUMIN 2.1* 2.1*   BNP 2760  ASSESSMENT AND PLAN:  Patient Active Hospital Problem List: Pneumonia (05/27/2011)    Atrial fibrillation (05/27/2011)  CHF   Elevated transaminase level (05/29/2011)     She continues to have atrial fibrillation  in the setting of her pneumonia with a rapid but improved ventricular response i think there is a component of HFPEF   willcontinue low dose diuretics as long as Bun/Cr ok ?? Is there a role for respiratory therapy to help with consolidation     AF HR better controlled mean 90  From AF perspective she is on coumadin   (per pharmacy so NOT on MAR)  Increasing HCO3 likely related to our diuresis so will back off   Signed, Sherryl Manges MD  06/02/2011

## 2011-06-02 NOTE — Progress Notes (Signed)
Speech Language/Pathology Clinical/Bedside Swallow Evaluation Patient Details  Name: Michelle Hopkins MRN: [id number] DOB: 76-Apr-1928 Today's Date: 06/02/2011  Past Medical History:  Past Medical History  Diagnosis Date  . COPD (chronic obstructive pulmonary disease)   . HTN (hypertension)   . Hypothyroidism   . Osteoporosis   . Postherpetic neuralgia    Past Surgical History:  Past Surgical History  Procedure Date  . Appendectomy 1996  . Left hip repair s/p fracture 2004   HPI:  78 yr old admitted from MD office with low 02 saturation, SOB, weak, decreased po intake and found to have pna.  PMH: COPD, GERD, HTN, pna 2007.  Pt. reports some difficulty masticating some foods in particular "lettuce" due to missing part of bottom implan.  Pt./family deny significant difficulty; "cough sometimes"   Assessment/Recommendations/Treatment Plan Suspected Esophageal Findings Suspected Esophageal Findings: Belching  SLP Assessment Clinical Impression Statement: Oral phase of swallow appeared WFL's for consistency observed.  No overt signs of aspiration exhibited, however, laryngeal elevation and coordination during swallow questionalby and intermittently reduced.  Pt. has history of COPD and may experience difficulty coordinating swallow and respirations, although she did not exhibit any increased work of breathing during evaluation.  Pt. 's reports her GERD is controlled with Prilosec that she "has not had" this admission and belched several times throughout study. She denys regurgitation of food.  Recommend pt. continue a regular texture diet and thin liquids.  Do not feel objective assessment is necessary presently but if pt. experiences increased difficutly or has a reccurance of pneumonia, an MBS would be recommended at that time.  Risk for Aspiration: Mild Other Related Risk Factors: History of GERD  Swallow Recommendations  Solid Consistency: Regular Liquid Consistency: Thin Liquid  Administration via: Cup Medication Administration: Whole meds with puree Supervision: Patient able to self feed;Intermittent supervision to cue for compensatory strategies Compensations: Follow solids with liquid;Slow rate;Small sips/bites (REST BREAKS DURING MEALS CUE TO COPD) Postural Changes and/or Swallow Maneuvers: Seated upright 90 degrees Oral Care Recommendations: Oral care QID Follow up Recommendations: None  Treatment Plan Treatment Plan Recommendations: Therapy as outlined in treatment plan below Speech Therapy Frequency: min 1 x/week Treatment Duration: 2 weeks Interventions: Diet toleration management by SLP;Patient/family education    Individuals Consulted Consulted and Agree with Results and Recommendations: Patient;Family member/caregiver  Swallowing Goals  SLP Swallowing Goals Patient will consume recommended diet without observed clinical signs of aspiration with: Minimal assistance Patient will utilize recommended strategies during swallow to increase swallowing safety with: Minimal assistance  Swallow Study General  HPI: 78 yr old admitted from MD office with low 02 saturation, SOB, weak, decreased po intake and found to have pna.  PMH: COPD, GERD, HTN, pna 2007.  Pt. reports some difficulty masticating some foods in particular "lettuce" due to missing part of bottom implan.  Pt./family deny significant difficulty; "cough sometimes" Type of Study: Bedside swallow evaluation Diet Prior to this Study: Regular;Thin liquids Respiratory Status: Supplemental O2 delivered via (comment) Behavior/Cognition: Alert;Cooperative;Pleasant mood Oral Cavity - Dentition:  (INCOMPLETE BOTTOM IMPLANTS) Patient Positioning: Upright in bed Baseline Vocal Quality: Other (comment) (MILD SPASTIC-LIKE QUALITY) Volitional Cough: Strong Volitional Swallow: Able to elicit Ice chips: Not tested  Oral Motor/Sensory Function  Labial ROM: Within Functional Limits Labial Symmetry: Within  Functional Limits Lingual ROM: Within Functional Limits Lingual Symmetry: Within Functional Limits Facial ROM: Within Functional Limits Facial Symmetry: Within Functional Limits Mandible: Within Functional Limits  Consistency Results  Ice Chips Ice chips: Not tested  Thin Liquid Thin  Liquid: Impaired Presentation: Straw;Cup Other Comments: QUESTIONABLE DECREASED LARYGNEAL ELEVATION/COORDINATING SWALLOW INITIATION  Nectar Thick Liquid Nectar Thick Liquid: Not tested  Honey Thick Liquid Honey Thick Liquid: Not tested  Puree Puree: Not tested  Solid Solid: Within functional limits Other Comments: NO DIFFICULTY WITH A MECHANICAL SOFT TEXTURE (RED VELVET CAKE)   Breck Coons [place].Ed [employer] [phone]  06/02/2011

## 2011-06-02 NOTE — Progress Notes (Signed)
  Pharmacy Consult for Lovenox/Coumadin Indication: atrial fibrillation  Assessment: [name] is a 29 yof with atrial fibrillation on full dose lovenox and coumadin. INR today 1.94. No bleeding noted.   Goal of Therapy:  INR 2-2.5 (lower therapeutic range d/t age and small frame) Full anticoagulation   Plan:  1. Continue lovenox at 40mg  sq q12 h (no change) 2. Give warfarin 2.5mg  x 1 today  3. F/u PT/INR in AM  Celedonio Miyamoto, PharmD, BCPS Infectious Disease Pharmacist Pager [phone]    Allergies  Allergen Reactions  . Avelox (Moxifloxacin Hcl In Nacl)   . Canarium     Unknown reaction  . Codeine   . Latex     Rash   . Neosporin (Neomycin-Polymyxin B)   . Rice   . Tape     Rips skin    Patient Measurements: Height: 5\' 2"  (157.5 cm) Weight: 89 lb 8.1 oz (40.6 kg) IBW/kg (Calculated) : 50.1   Vital Signs: Temp: 97.9 F (36.6 C) (01/08 0735) Temp src: Oral (01/08 0735) BP: 137/66 mmHg (01/08 0735) Pulse Rate: 83  (01/08 0735)  Labs:  Basename 06/02/11 0358 06/01/11 0717 05/31/11 0545  HGB 11.9* 11.9* --  HCT 37.2 37.3 35.7*  PLT 299 272 265  APTT -- -- --  LABPROT 22.5* 17.8* 14.3  INR 1.94* 1.44 1.09  HEPARINUNFRC -- -- --  CREATININE 0.63 0.55 0.55  CKTOTAL -- -- --  CKMB -- -- --  TROPONINI -- -- --    Mickeal Skinner 06/02/2011,7:59 AM

## 2011-06-02 NOTE — Progress Notes (Signed)
OT Cancellation Note  Treatment cancelled today due to patient's refusal to participate: patient still eating lunch with family assist- asked therapist to attempt later this pm. Will do so if schedule allows. Thanks!  [place]endale Chard, OTR/L Pager: [phone] 06/02/2011

## 2011-06-02 NOTE — Progress Notes (Signed)
Name: Lametria Klunk MRN: [id number] DOB: [birth year]    LOS: 6  [facility]  History of Present Illness:  78 year old female presented to PCP on 1/2 with CC: 2-3 weeks of weakness and fatigue. Dx eval demonstrated AF w/ RVR, and abnormal CXR (bilateral infiltrates). She was admitted for further eval. RX included broad spectrum antibiotics, oxygen and cards consult for RVR . On 1/7 she was noted to have progressive bilateral airspace disease and effusions. Pulm asked to evaluate.   Lines / Drains:   Cultures: MRSA 1/2: negative Urine strep antigen 1/7>>> negative Urine legionella antigen 1/7>>> Procalcitonin 1/7>>> <0.10  Antibiotics: Imipenem (PNA) 1/2>>>1/7 vanc (progressive PNA) 1/6>>>1/7 Azithromycin 1/7>>> ceftraixne 1/7>>>   Tests / Events: Echo: 1/3: Left ventricle: The cavity size was normal. Wall thickness was normal. Systolic function was normal. The estimated ejection fraction was in the range of 55% to 60%. Wall motion was normal; there were no regional wall motion abnormalities. The study is not technically sufficient to allow evaluation of LV diastolic function.- Mitral valve: Moderate regurgitation- Left atrium: The atrium was moderately dilated. - Right ventricle: The cavity size was mildly dilated.- Right atrium: The atrium was moderately dilated.- Tricuspid valve: Severe regurgitation. - Pulmonary arteries: Systolic pressure was severely increased. ESR 1/7>>> 27 Pro-BNP 1/7>>> 2354  Overnight events:      Vital Signs: Temp:  [97.6 F (36.4 C)-99.7 F (37.6 C)] 97.9 F (36.6 C) (01/08 0735) Pulse Rate:  [57-108] 88  (01/08 1020) Resp:  [6-25] 21  (01/08 1020) BP: (100-137)/(55-88) 110/72 mmHg (01/08 1020) SpO2:  [90 %-100 %] 90 % (01/08 1020) Weight:  [40.6 kg (89 lb 8.1 oz)] 89 lb 8.1 oz (40.6 kg) (01/08 0032)    I/O last 3 completed shifts: In: 631 [P.O.:360; I.V.:270; IV Piggyback:1] Out: 3650 [Urine:3650]  Physical  Examination: Gen: frail elderly female in no acute distress HEENT: NCAT, PERRL, EOMi, MM dry PULM: Diminished R base, insp crackles L base CV: RRR, no mgr, no JVD AB: soft, nontender, no hsm Ext: warm, no edema, no clubbing, no cyanosis Derm: thin skin with scattered bruises Neuro: A&Ox4, CN II-XII intact, strength 5/5 in all 4 extremities   Ventilator settings:    Labs and Imaging:   Lab 06/02/11 0358 06/01/11 0717 05/31/11 0545  NA 136 138 141  K 4.1 3.7 3.8  CL 94* 96 100  CO2 37* 33* 34*  BUN 22 18 18   CREATININE 0.63 0.55 0.55  GLUCOSE 94 88 94    Lab 06/02/11 0358 06/01/11 0717 05/31/11 0545  HGB 11.9* 11.9* 11.5*  HCT 37.2 37.3 35.7*  WBC 11.6* 9.3 10.3  PLT 299 272 265  PCXR: bilateral airspace disease. Aeration worse c/w film on 1/4.   Bedside US: bilateral effusions. Mod size.  Leg ultrasound: no DVT  Assessment and Plan: Hypoxic respiratory failure in the setting of bilateral pulmonary infiltrates w/ effusions, volume overload, and basilar consolidation.  It is not surprising at all that she is hypoxemic considering the amount of emphysema on CXR in addition to airflow limitation, restriction (kyphosis) and volume overload.  She has been volume up after a-fib with rvr, may have also aspirated given basilar infiltrates and history of aspirating.  Lab 06/02/11 0358 06/01/11 0717 05/31/11 0545  WBC 11.6* 9.3 10.3    Lab 06/01/11 1012  PROCALCITON <0.10   Erythrocyte Sedimentation Rate     Component Value Date/Time   ESRSEDRATE 27* 06/01/2011 1012   Pro B Natriuretic peptide (BNP)  Date Value Range Status  06/01/2011 2354.0* 0-450 (pg/mL) Final  Rec/plan: -repeat CXR today -continue antibiotics as written for 7 day course -speech evaluation today to evaluate for aspiration -push diuresis at tolerated -will consider thora if distress or increased fevers, clinical declines. Avoid for now INR noted, and developed effusion rapid in hospital likley comparing  CT chest findings   ID -speech evaluation for aspiration -continue ceftriaxone and azithro   Julion Gatt 06/02/2011, 11:15 AM

## 2011-06-02 NOTE — Plan of Care (Signed)
Problem: Phase II Progression Outcomes Goal: Other Phase II Outcomes/Goals Bedside swallow assessment completed.  Please refer to report for details.  Breck Coons [place].Ed [employer] [phone]  06/02/2011

## 2011-06-02 NOTE — Progress Notes (Signed)
Subjective: Breathing appears much more relaxed and her sats are better.   She feels a bit better too.  No new issues overnight.  Objective: Vital signs in last 24 hours: Temp:  [97.6 F (36.4 C)-99.7 F (37.6 C)] 97.9 F (36.6 C) (01/08 0735) Pulse Rate:  [57-108] 83  (01/08 0735) Resp:  [6-25] 13  (01/08 0735) BP: (100-137)/(55-88) 137/66 mmHg (01/08 0735) SpO2:  [90 %-100 %] 96 % (01/08 0735) Weight:  [40.6 kg (89 lb 8.1 oz)] 89 lb 8.1 oz (40.6 kg) (01/08 0032) Weight change: -3.4 kg (-7 lb 7.9 oz) Last BM Date:  (pt not sure of last BM)  Intake/Output from previous day: 01/07 0701 - 01/08 0700 In: 521 [P.O.:360; I.V.:160; IV Piggyback:1] Out: 3250 [Urine:3250] Intake/Output this shift:   General appearance: alert, cooperative, appears stated age and no distress  Resp: diminished breath sounds bibasilar with some improvement Chest wall: no tenderness  Cardio: irregularly irregular rhythm  GI: soft, non-tender; bowel sounds normal; no masses, no organomegaly  Extremities: extremities normal, atraumatic, no cyanosis or edema    Lab Results:  [facility] 06/02/11 0358 06/01/11 0717  WBC 11.6* 9.3  HGB 11.9* 11.9*  HCT 37.2 37.3  PLT 299 272   BMET  Basename 06/02/11 0358 06/01/11 0717  NA 136 138  K 4.1 3.7  CL 94* 96  CO2 37* 33*  GLUCOSE 94 88  BUN 22 18  CREATININE 0.63 0.55  CALCIUM 8.4 8.3*    Studies/Results: No results found.  Medications:  I have reviewed the patient's current medications. Scheduled:   . antiseptic oral rinse  15 mL Mouth Rinse BID  . azithromycin  500 mg Intravenous Q24H  . cefTRIAXone (ROCEPHIN)  IV  1 g Intravenous Q24H  . diltiazem  180 mg Oral Daily  . feeding supplement  237 mL Oral BID BM  . Fluticasone-Salmeterol  1 puff Inhalation BID  . furosemide  10 mg Intravenous Q8H  . gabapentin  300 mg Oral TID  . levalbuterol  0.63 mg Nebulization Q6H  . levothyroxine  88 mcg Oral QAC breakfast  . mirtazapine  15 mg Oral QHS    . pantoprazole  40 mg Oral Q1200  . potassium chloride  20 mEq Oral Q8H  . predniSONE  20 mg Oral Q breakfast  . tiotropium  18 mcg Inhalation Daily  . warfarin  2.5 mg Oral ONCE-1800  . warfarin  4 mg Oral ONCE-1800  . DISCONTD: enoxaparin (LOVENOX) injection  40 mg Subcutaneous Q12H  . DISCONTD: furosemide  40 mg Intravenous Daily  . DISCONTD: imipenem-cilastatin  250 mg Intravenous Q12H  . DISCONTD: vancomycin  500 mg Intravenous Q24H   Continuous:   . sodium chloride 10 mL/hr at 06/01/11 1847   PRN:  Assessment/Plan: 1) Multilobar Pneumonia. Continue Primaxin. Adding Vancomycin today. The CT chest did not show indication of malignancy. Continuing inhalers, nebs, Vanc added yesterday.Appreciate Pulmonary consult.  WIll move to tele floor.  Noevidence of DVT on dopplers 2) Afib. TSH normal, most likely related to her respiratory illness. Cards following with an Echo showing normal LVEF, but moderate MR and TR with higher R sided pressures.. Oral Cardizem CD and will d/c Lovenox as INR 1.9 today.  3) Osteoporosis: As above.  4) Weight loss. Eating well at this point, added supplements earlier in her hospitalization .5) HTN Controlled, Cardizem as well.  6) WBC Normalized  7) Herpetic neuralgia: Continue Neurontin.  8) PT/OT as capable in step down, incentive spirometer. Will get  social work to help arrange home oxygen, home health needs for end of week possibly.  LOS: 6 days   Tecla Mailloux W 06/02/2011, 8:32 AM

## 2011-06-03 ENCOUNTER — Inpatient Hospital Stay (HOSPITAL_COMMUNITY): Payer: Medicare Other

## 2011-06-03 LAB — CBC
Hemoglobin: 12.6 g/dL (ref 12.0–15.0)
MCH: 28.2 pg (ref 26.0–34.0)
MCV: 86.6 fL (ref 78.0–100.0)
RBC: 4.47 MIL/uL (ref 3.87–5.11)

## 2011-06-03 LAB — COMPREHENSIVE METABOLIC PANEL
ALT: 41 U/L — ABNORMAL HIGH (ref 0–35)
CO2: 33 mEq/L — ABNORMAL HIGH (ref 19–32)
Calcium: 8.7 mg/dL (ref 8.4–10.5)
Creatinine, Ser: 0.66 mg/dL (ref 0.50–1.10)
GFR calc Af Amer: >90 mL/min (ref >90)
GFR calc non Af Amer: 80 mL/min — ABNORMAL LOW (ref >90)
Glucose, Bld: 80 mg/dL (ref 70–99)
Sodium: 133 mEq/L — ABNORMAL LOW (ref 135–145)
Total Bilirubin: 0.2 mg/dL — ABNORMAL LOW (ref 0.3–1.2)

## 2011-06-03 MED ORDER — FUROSEMIDE 10 MG/ML IJ SOLN
40.0000 mg | Freq: Once | INTRAMUSCULAR | Status: AC
  Administered 2011-06-03: 40 mg via INTRAVENOUS
  Filled 2011-06-03: qty 4

## 2011-06-03 MED ORDER — DEXTROSE 5 % IV SOLN
1.0000 g | INTRAVENOUS | Status: DC
  Administered 2011-06-03 – 2011-06-08 (×6): 1 g via INTRAVENOUS
  Filled 2011-06-03 (×8): qty 10

## 2011-06-03 MED ORDER — DEXTROSE 5 % IV SOLN
500.0000 mg | INTRAVENOUS | Status: DC
  Administered 2011-06-03 – 2011-06-08 (×6): 500 mg via INTRAVENOUS
  Filled 2011-06-03 (×8): qty 500

## 2011-06-03 NOTE — Progress Notes (Signed)
PT Cancellation Note  Treatment cancelled today due to patient receiving procedure or test- radiology.  Will check back at a later date.  Thanks.  [facility] [phone] (pager)

## 2011-06-03 NOTE — Progress Notes (Signed)
ANTICOAGULATION CONSULT NOTE - Follow Up Consult  Pharmacy Consult for Coumadin  Indication: atrial fibrillation  Allergies  Allergen Reactions  . Avelox (Moxifloxacin Hcl In Nacl)   . Canarium     Unknown reaction  . Codeine   . Latex     Rash   . Neosporin (Neomycin-Polymyxin B)   . Rice   . Tape     Rips skin    Patient Measurements: Height: 5\' 2"  (157.5 cm) Weight: 89 lb 8.1 oz (40.6 kg) IBW/kg (Calculated) : 50.1   Vital Signs: Temp: 98.3 F (36.8 C) (01/09 0500) BP: 100/64 mmHg (01/09 0500) Pulse Rate: 78  (01/09 0500)  Labs:  Basename 06/03/11 0605 06/02/11 0358 06/01/11 0717  HGB 12.6 11.9* --  HCT 38.7 37.2 37.3  PLT 315 299 272  APTT -- -- --  LABPROT 29.7* 22.5* 17.8*  INR 2.77* 1.94* 1.44  HEPARINUNFRC -- -- --  CREATININE 0.66 0.63 0.55  CKTOTAL -- -- --  CKMB -- -- --  TROPONINI -- -- --   Estimated Creatinine Clearance: 34.7 ml/min (by C-G formula based on Cr of 0.66).  Assessment: 13 yof with atrial fibrillation on coumadin. INR above goal today, big jump from 1.94 to 2.77. CBC stable, No bleeding noted.  Goal of Therapy:  INR = 2-2.5   Plan:  No coumadin today F/u INR tomorrow morning  Riki Rusk 06/03/2011,9:55 AM

## 2011-06-03 NOTE — Plan of Care (Signed)
Problem: Phase I Progression Outcomes Goal: Other Phase I Outcomes/Goals Outcome: Completed/Met Date Met:  06/03/11 Patients airway remained patent during the course of the shift, no signs of respiratory or cardiac distress  Problem: Phase II Progression Outcomes Goal: Other Phase II Outcomes/Goals Outcome: Progressing Patient remained free from injury room intact and clutter free during the course of the shift  Problem: Phase III Progression Outcomes Goal: Other Phase III Outcomes/Goals Outcome: Completed/Met Date Met:  06/03/11 Patient denies pain during the course of the shift

## 2011-06-03 NOTE — Progress Notes (Signed)
Nutrition Follow-up  S/p bedside swallow evaluation 1/8 -- SLP recommending Regular, thin liquid diet. Noted N/V this am. Reviewed speech path dysphagia treatment note 1/9 -- for esophagram.  Diet Order:  NPO. Previously on a Regular, thin liquid diet. PO intake variable at 25-75% per flowsheet records. Ensure Clinical Strength PO BID.  Meds: Scheduled Meds:   . antiseptic oral rinse  15 mL Mouth Rinse BID  . azithromycin  500 mg Intravenous Q24H  . cefTRIAXone (ROCEPHIN)  IV  1 g Intravenous Q24H  . diltiazem  180 mg Oral Daily  . feeding supplement  237 mL Oral BID BM  . Fluticasone-Salmeterol  1 puff Inhalation BID  . furosemide  40 mg Intravenous Once  . gabapentin  300 mg Oral TID  . levalbuterol  0.63 mg Nebulization Q6H  . levothyroxine  88 mcg Oral QAC breakfast  . mirtazapine  15 mg Oral QHS  . mirtazapine  15 mg Oral QHS  . omeprazole  20 mg Oral Q breakfast  . tiotropium  18 mcg Inhalation Daily  . warfarin  2.5 mg Oral ONCE-1800  . DISCONTD: azithromycin  500 mg Intravenous Q24H  . DISCONTD: cefTRIAXone (ROCEPHIN)  IV  1 g Intravenous Q24H  . DISCONTD: pantoprazole  40 mg Oral Q1200  . DISCONTD: predniSONE  20 mg Oral Q breakfast   Continuous Infusions:   . sodium chloride 10 mL/hr at 06/01/11 1847   PRN Meds:.ondansetron (ZOFRAN) IV  Labs:  CMP     Component Value Date/Time   NA 133* 06/03/2011 0605   K 4.9 06/03/2011 0605   CL 95* 06/03/2011 0605   CO2 33* 06/03/2011 0605   GLUCOSE 80 06/03/2011 0605   BUN 20 06/03/2011 0605   CREATININE 0.66 06/03/2011 0605   CALCIUM 8.7 06/03/2011 0605   PROT 5.4* 06/03/2011 0605   ALBUMIN 2.4* 06/03/2011 0605   AST 40* 06/03/2011 0605   ALT 41* 06/03/2011 0605   ALKPHOS 130* 06/03/2011 0605   BILITOT 0.2* 06/03/2011 0605   GFRNONAA 80* 06/03/2011 0605   GFRAA >90 06/03/2011 0605     Intake/Output Summary (Last 24 hours) at 06/03/11 1423 Last data filed at 06/03/11 0934  Gross per 24 hour  Intake      0 ml  Output    400 ml  Net   -400 ml     Weight Status:  40.6 kg (1/8) -- stable  Nutrition Dx:  Inadequate Oral Intake, ongoing  Goal:  PO intake >50% with meals and supplements to promote repletion of nutrient stores, unmet Monitor: PO intake, labs, weight, I/O's  Intervention:    Await esophagram results, further SLP recommendations   Continue Ensure Clinical Strength PO BID as tolerated  RD to follow for nutrition care plan  Alger Memos Pager #:  [phone]

## 2011-06-03 NOTE — Progress Notes (Signed)
Shift report received from [employer].  Patient resting in bed with o2@ 6L [place].  Patient is free from injury, room intact and clutter free.  Patient skin is fragile with generalized weakness and discoloration to the lower extremities.  Patient has received IV Zofran for nausea , she also received Remeron.  Patient denies any other verbal request or complaints.  She has had a pleasant night, no signs of respiratory/cardiac distress.  Call bell in reach, side rails in an upright position and bed alarm engaged.  No further pertinent findings at this time.  Will continue to monitor patient during the course of the shift.Marland Kitchen

## 2011-06-03 NOTE — Progress Notes (Signed)
Abx consult:  D/w Dr. Kendrick Fries, continue rocephin/azith for PNA.  1. Rocephin 1g IV q24 2. Azith 500mg  IV q24

## 2011-06-03 NOTE — Progress Notes (Signed)
[facility] PCCM Consult: Family conversation/code clarification note  I discussed the patient's code status with her and her family (daughter Joyce Gross and granddaughter) and clarified that she does not wish to be on a ventilator or receive CPR.  Apparently she has been clear with them about this for years.  Based on this I wrote and order for DNR, Full no code blue.  MCQUAID, DOUGLAS

## 2011-06-03 NOTE — Progress Notes (Signed)
Subjective: Moved to tele floor.  Doing better this AM.  No new issues other than some difficulty with a piece of meat last night.  Had speech eval.  Objective: Vital signs in last 24 hours: Temp:  [98.1 F (36.7 C)-99.1 F (37.3 C)] 98.3 F (36.8 C) (01/09 0500) Pulse Rate:  [78-121] 78  (01/09 0500) Resp:  [2-22] 18  (01/09 0500) BP: (100-110)/(64-79) 100/64 mmHg (01/09 0500) SpO2:  [90 %-97 %] 91 % (01/09 0500) Weight change:  Last BM Date:  (pt not sure of last BM)  Intake/Output from previous day: 01/08 0701 - 01/09 0700 In: 370 [P.O.:360; I.V.:10] Out: 1000 [Urine:1000] Intake/Output this shift:   General appearance: alert, cooperative, appears stated age and no distress  Resp: better aeration, no adventitia Chest wall: no tenderness  Cardio: irregularly irregular rhythm  GI: soft, non-tender; bowel sounds normal; no masses, no organomegaly  Extremities: extremities normal, atraumatic, no cyanosis or edema    Lab Results:  [facility] 06/03/11 0605 06/02/11 0358  WBC 13.0* 11.6*  HGB 12.6 11.9*  HCT 38.7 37.2  PLT 315 299   BMET  Basename 06/03/11 0605 06/02/11 0358  NA 133* 136  K 4.9 4.1  CL 95* 94*  CO2 33* 37*  GLUCOSE 80 94  BUN 20 22  CREATININE 0.66 0.63  CALCIUM 8.7 8.4    Studies/Results: Dg Chest 2 View  06/02/2011  *RADIOLOGY REPORT*  Clinical Data: Short of breath  CHEST - 2 VIEW  Comparison: 05/31/2011  Findings: Severe COPD with scarring.  Improved aeration of the lung bases.  Improvement in bilateral effusions.  Vascular congestion has resolved.  IMPRESSION: Improving congestive heart failure with bilateral effusions.  Underlying pneumonia not excluded but the findings may all be due to heart failure.  Original Report Authenticated By: Camelia Phenes, M.D.    Medications:  I have reviewed the patient's current medications. Scheduled:   . antiseptic oral rinse  15 mL Mouth Rinse BID  . azithromycin  500 mg Intravenous Q24H  . cefTRIAXone  (ROCEPHIN)  IV  1 g Intravenous Q24H  . diltiazem  180 mg Oral Daily  . feeding supplement  237 mL Oral BID BM  . Fluticasone-Salmeterol  1 puff Inhalation BID  . gabapentin  300 mg Oral TID  . levalbuterol  0.63 mg Nebulization Q6H  . levothyroxine  88 mcg Oral QAC breakfast  . mirtazapine  15 mg Oral QHS  . mirtazapine  15 mg Oral QHS  . omeprazole  20 mg Oral Q breakfast  . potassium chloride  20 mEq Oral Q8H  . predniSONE  20 mg Oral Q breakfast  . tiotropium  18 mcg Inhalation Daily  . warfarin  2.5 mg Oral ONCE-1800  . DISCONTD: enoxaparin (LOVENOX) injection  40 mg Subcutaneous Q12H  . DISCONTD: furosemide  10 mg Intravenous Q8H  . DISCONTD: pantoprazole  40 mg Oral Q1200   Continuous:   . sodium chloride 10 mL/hr at 06/01/11 1847   ZOX:[id number] (ZOFRAN) IV  Assessment/Plan: 1) Multilobar Pneumonia. Will d/c meds today as X-ray shows clearing.  WIll d/c steroids as well. 2) Afib. TSH normal, most likely related to her respiratory illness. Cards following with an Echo showing normal LVEF, but moderate MR and TR with higher R sided pressures.. Oral Cardizem CD and will d/c Lovenox as INR 1.9 today.  3) Osteoporosis: As above.  4) Weight loss. Eating well at this point, added supplements earlier in her hospitalization  .5) HTN Controlled, Cardizem as  well.  6) WBC Normalized  7) Herpetic neuralgia: Continue Neurontin.  8) PT/OT as capable in step down, incentive spirometer.  Will get social work to help arrange home oxygen, home health needs for end of week possibly. Possibly home tomorrow if other team members feel she can continue with outpatient care and oxygen.   LOS: 7 days   Jairen Goldfarb W 06/03/2011, 7:37 AM

## 2011-06-03 NOTE — Progress Notes (Signed)
Spoke with Dr Kendrick Fries about patient's 02 Saturation level in mid 80's and questioned the need for transfer to stepdown unit for better monitoring. He discussed code status with patient and her family and make the patient DNR.

## 2011-06-03 NOTE — Progress Notes (Signed)
Patient Name: Michelle Hopkins      SUBJECTIVE: feelling better  Antibiotics adjusted per IM and pukmonary Past Medical History  Diagnosis Date  . COPD (chronic obstructive pulmonary disease)   . HTN (hypertension)   . Hypothyroidism   . Osteoporosis   . Postherpetic neuralgia     PHYSICAL EXAM Filed Vitals:   06/02/11 2028 06/02/11 2200 06/03/11 0500 06/03/11 0818  BP:  100/67 100/64   Pulse:  93 78   Temp:  98.1 F (36.7 C) 98.3 F (36.8 C)   TempSrc:      Resp:  18 18   Height:      Weight:      SpO2: 94% 91% 91% 93%  BP 100/64  Pulse 78  Temp(Src) 98.3 F (36.8 C) (Oral)  Resp 18  Ht 5\' 2"  (1.575 m)  Wt 89 lb 8.1 oz (40.6 kg)  BMI 16.37 kg/m2  SpO2 93%  Temp (24hrs), Avg:98.4 F (36.9 C), Min:98.1 F (36.7 C), Max:99.1 F (37.3 C)  Wt down 10 lbs since admission  Well developed and nourished in moderate respiratory distress  HENT normal Neck supple with JVP-flat Carotids brisk and full without bruits Wheezes and rhonchi bilaterally decreased breath sounds left Irregularly irregular rate and rhythm with rapid   ventricular response, no murmurs or gallops Abd-soft with active BS without hepatomegaly No Clubbing cyanosis edema Skin-warm and dry A & Oriented  Grossly normal sensory and motor function  TELEMETRY: Reviewed telemetry pt in af with  VR now in the 90s  Intake/Output Summary (Last 24 hours) at 06/03/11 0821 Last data filed at 06/02/11 1135  Gross per 24 hour  Intake    360 ml  Output   1000 ml  Net   -640 ml  I/O --3L net   LABS: Basic Metabolic Panel:  Lab 06/03/11 0102 06/02/11 0358 06/01/11 0717 05/31/11 0545 05/30/11 0600 05/29/11 0630 05/28/11 0330  NA 133* 136 138 141 140 137 136  K 4.9 4.1 3.7 3.8 4.2 3.9 4.1  CL 95* 94* 96 100 105 105 103  CO2 33* 37* 33* 34* 30 27 29   GLUCOSE 80 94 88 94 96 101* 113*  BUN 20 22 18 18 19 19 22   CREATININE 0.66 0.63 0.55 0.55 0.50 0.51 0.60  CALCIUM 8.7 8.4 -- -- -- -- --  MG -- -- -- --  -- -- --  PHOS -- -- -- -- -- -- --   Cardiac Enzymes: No results found for this basename: CKTOTAL:3,CKMB:3,CKMBINDEX:3,TROPONINI:3 in the last 72 hours CBC:  Lab 06/03/11 0605 06/02/11 0358 06/01/11 0717 05/31/11 0545 05/30/11 0600 05/29/11 0630 05/28/11 0330  WBC 13.0* 11.6* 9.3 10.3 8.6 11.1* 12.8*  NEUTROABS -- -- -- -- -- -- --  HGB 12.6 11.9* 11.9* 11.5* 11.3* 11.4* 11.3*  HCT 38.7 37.2 37.3 35.7* 35.5* 35.3* 34.9*  MCV 86.6 86.9 87.1 87.9 89.2 87.6 86.8  PLT 315 299 272 265 241 212 203   PROTIME:  Basename 06/03/11 0605 06/02/11 0358 06/01/11 0717  LABPROT 29.7* 22.5* 17.8*  INR 2.77* 1.94* 1.44   Liver Function Tests:  Basename 06/03/11 0605 06/02/11 0358  AST 40* 28  ALT 41* 32  ALKPHOS 130* 137*  BILITOT 0.2* 0.2*  PROT 5.4* 5.3*  ALBUMIN 2.4* 2.1*   BNP 2760  ASSESSMENT AND PLAN:  Patient Active Hospital Problem List: Pneumonia (05/27/2011)    Atrial fibrillation (05/27/2011)  CHF   Elevated transaminase level (05/29/2011)     She continues to have  atrial fibrillation in the setting of her pneumonia with a rapid but improved ventricular response i think there is a component of HFPEF ; she is off diuretics,  Will try gingerly one more day but the alkalemia is concerningin Will recheck BNP   AF HR better controlled mean 90  From AF perspective she is on coumadin   (per pharmacy so NOT on MAR)      Signed, Sherryl Manges MD  06/03/2011    Signed, Sherryl Manges MD  06/03/2011     Signed, Sherryl Manges MD  06/03/2011

## 2011-06-03 NOTE — Progress Notes (Signed)
Speech Pathology:  Dysphagia Treatment Note  Subjective:  Awake, alert, sitting upright in bed  Objective: Upon entry, patient spitting up into a basin.  Family, patient, and RN all reporting acute onset of regurgitation/vomiting of all food/liquids since last evening with no ability to "keep anything down."  Observed a sip of thin liquid that was judged to be functional for oral-pharyngeal transit, however immediately upon and after swallow, patient demonstrated excessive belching with a wet quality as well as full regurgitation of the water into a basin.  Patient' body posture also suddenly changed to extension, likely as a postural compensation, patient stating "I feel like I'm trying to stop it from coming back up."  Shared SLP's theory of possible esophageal stricture or other esophageal related problem.  Assessment:  Concerned for an esophageal based deficit.  Patient reports taking PPI, Prilosec for 7 or 8 years, not taking it while in the hospital and then restarting last night with these symptoms following.  Unclear if there is a direct correlation or a coincidence.  Suggest MD consider an esophageal work-up as patient's nutritional risks are high.  Recommendations:  1. Await work-up of esophageal phase 2. Continue Regular/thin liquid diet as tolerated 3. Will f/u 06/04/11.  Pain:   none Intervention Required:   No  Goals: No Goals Met  Myra Rude, M.S.,CCC-SLP Pager [phone]

## 2011-06-03 NOTE — Progress Notes (Signed)
Speech Language Pathology  Telephone consultation with Dr. Wylene Simmer who agreed for concern of a possible esophageal deficit (? Stricture), based on patient's signs and symptom report.  Dr. Wylene Simmer gave verbal order for an esophagram.  Myra Rude, M.S.,CCC-SLP Pager [phone]

## 2011-06-03 NOTE — Progress Notes (Signed)
Clinical Social Worker completed the psychosocial assessment, which can be found in the shadow chart. FL-2 completed and placed in shadow chart for MD signature. Ms. Christiana insurance will require pre-authorization prior to her discharge from hospital. CSW initiated skilled facility search and patient/family was given bed offers late afternoon. Patient will continue to monitor patient progress and follow-up with patient's preferred facility to assure they will be able to accept patient when she is medically stable for discharge.  Genelle Bal, MSW, LCSW [phone]

## 2011-06-03 NOTE — Progress Notes (Addendum)
I spoke to Dr. Felipa Eth on call for Dr. Wylene Simmer, regarding Michelle Hopkins's status.  I informed him of the results of her Barium Swallow performed today, as well as her NPO status in regards to her medications not being administered. He explained that she would have a GI consult. 02 is 89 on 6L. Will continue to monitor.

## 2011-06-03 NOTE — Progress Notes (Signed)
Pt continues to have small amount of vomiting and "reflux." Her 02 sats on 6l/min [place] ranging from 83-89%. Dr Wylene Simmer notified, CXR ordered. Will consult with speech therapy and Pulmonary MD.

## 2011-06-03 NOTE — Progress Notes (Signed)
Name: Tulip Meharg MRN: [id number] DOB: January 02, 1927    LOS: 7  [facility]  History of Present Illness:  78 year old female presented to PCP on 1/2 with CC: 2-3 weeks of weakness and fatigue. Dx eval demonstrated AF w/ RVR, and abnormal CXR (bilateral infiltrates). She was admitted for further eval. RX included broad spectrum antibiotics, oxygen and cards consult for RVR . On 1/7 she was noted to have progressive bilateral airspace disease and effusions. Pulm asked to evaluate.   Lines / Drains:   Cultures: MRSA 1/2: negative Urine strep antigen 1/7>>> negative Urine legionella antigen 1/7>>>neg Procalcitonin 1/7>>> <0.10  Antibiotics: Imipenem (PNA) 1/2>>>1/7 vanc (progressive PNA) 1/6>>>1/7 Azithromycin 1/7>>> ceftraixne 1/7>>>   Tests / Events: Echo: 1/3: Left ventricle: The cavity size was normal. Wall thickness was normal. Systolic function was normal. The estimated ejection fraction was in the range of 55% to 60%. Wall motion was normal; there were no regional wall motion abnormalities. The study is not technically sufficient to allow evaluation of LV diastolic function.- Mitral valve: Moderate regurgitation- Left atrium: The atrium was moderately dilated. - Right ventricle: The cavity size was mildly dilated.- Right atrium: The atrium was moderately dilated.- Tricuspid valve: Severe regurgitation. - Pulmonary arteries: Systolic pressure was severely increased. ESR 1/7>>> 27 Pro-BNP 1/7>>> 2354  Overnight events: This morning after breakfast she has had a lot of vomiting and coughing.        Vital Signs: Temp:  [98.1 F (36.7 C)-99.1 F (37.3 C)] 98.3 F (36.8 C) (01/09 0500) Pulse Rate:  [78-103] 78  (01/09 0500) Resp:  [18-22] 18  (01/09 0500) BP: (100-109)/(64-72) 100/64 mmHg (01/09 0500) SpO2:  [91 %-97 %] 93 % (01/09 0818)    I/O last 3 completed shifts: In: 851 [P.O.:720; I.V.:130; IV Piggyback:1] Out: 1400 [Urine:1400]  Physical  Examination: Gen: frail elderly female vomiting profusely (clear, frothy emesis and food) HEENT: NCAT, PERRL, EOMi, MM dry PULM: Diminished R base, insp crackles L base CV: RRR, no mgr, no JVD AB: soft, nontender, no hsm Ext: warm, no edema, no clubbing, no cyanosis Derm: thin skin with scattered bruises Neuro: A&Ox4, CN II-XII intact, strength 5/5 in all 4 extremities   Ventilator settings:    Labs and Imaging:   Lab 06/03/11 0605 06/02/11 0358 06/01/11 0717  NA 133* 136 138  K 4.9 4.1 3.7  CL 95* 94* 96  CO2 33* 37* 33*  BUN 20 22 18   CREATININE 0.66 0.63 0.55  GLUCOSE 80 94 88    Lab 06/03/11 0605 06/02/11 0358 06/01/11 0717  HGB 12.6 11.9* 11.9*  HCT 38.7 37.2 37.3  WBC 13.0* 11.6* 9.3  PLT 315 299 272  PCXR: bilateral airspace disease. Aeration worse c/w film on 1/4.   Bedside US: bilateral effusions. Mod size.  Leg ultrasound: no DVT  Assessment and Plan: Hypoxic respiratory failure in the setting of bilateral pulmonary infiltrates w/ effusions, volume overload, and basilar consolidation.  It is not surprising at all that she is hypoxemic considering the amount of emphysema on CXR in addition to airflow limitation, restriction (kyphosis) and volume overload.  She has been volume up after a-fib with rvr, may have also aspirated given basilar infiltrates and history of aspirating. Now with significant nausea and vomiting this morning.  I feel strongly that if she does not have oropharyngeal aspiration that she has esophageal dysmotility or an obstruction of some sort leading to recurrent aspiration.  Lab 06/03/11 0605 06/02/11 0358 06/01/11 0717  WBC 13.0*  11.6* 9.3    Lab 06/01/11 1012  PROCALCITON <0.10   Erythrocyte Sedimentation Rate     Component Value Date/Time   ESRSEDRATE 27* 06/01/2011 1012   Pro B Natriuretic peptide (BNP)  Date Value Range Status  06/01/2011 2354.0* 0-450 (pg/mL) Final  Rec/plan:  -esophagram scheduled per speech path note, I agree  completely with this and think that she should be npo prior to having that procedure -continue antibiotics as written for 7 day course -diuresis as tolerated, though I think she is near being euvolemic -I don't feel strongly that a thoracentesis will make a significant difference in her symptoms at this point unless it grows significantly   ID -speech evaluation for aspiration -continue ceftriaxone and azithro  Dispo:  -likely needs to have aspiration and esophageal dysmotility addressed prior to discharge home  MCQUAID, DOUGLAS 06/03/2011, 12:34 PM

## 2011-06-04 DIAGNOSIS — J449 Chronic obstructive pulmonary disease, unspecified: Secondary | ICD-10-CM

## 2011-06-04 DIAGNOSIS — K222 Esophageal obstruction: Secondary | ICD-10-CM | POA: Diagnosis present

## 2011-06-04 DIAGNOSIS — T18108A Unspecified foreign body in esophagus causing other injury, initial encounter: Secondary | ICD-10-CM | POA: Diagnosis present

## 2011-06-04 DIAGNOSIS — J13 Pneumonia due to Streptococcus pneumoniae: Secondary | ICD-10-CM

## 2011-06-04 DIAGNOSIS — R933 Abnormal findings on diagnostic imaging of other parts of digestive tract: Secondary | ICD-10-CM

## 2011-06-04 DIAGNOSIS — I4891 Unspecified atrial fibrillation: Secondary | ICD-10-CM

## 2011-06-04 LAB — COMPREHENSIVE METABOLIC PANEL
Alkaline Phosphatase: 141 U/L — ABNORMAL HIGH (ref 39–117)
BUN: 23 mg/dL (ref 6–23)
Calcium: 8.4 mg/dL (ref 8.4–10.5)
Creatinine, Ser: 0.67 mg/dL (ref 0.50–1.10)
GFR calc Af Amer: >90 mL/min (ref >90)
Glucose, Bld: 89 mg/dL (ref 70–99)
Total Protein: 5.9 g/dL — ABNORMAL LOW (ref 6.0–8.3)

## 2011-06-04 LAB — PROTIME-INR: Prothrombin Time: 24.8 seconds — ABNORMAL HIGH (ref 11.6–15.2)

## 2011-06-04 LAB — CBC
HCT: 42.5 % (ref 36.0–46.0)
Hemoglobin: 13.9 g/dL (ref 12.0–15.0)
MCH: 28.1 pg (ref 26.0–34.0)
MCHC: 32.7 g/dL (ref 30.0–36.0)
MCV: 86 fL (ref 78.0–100.0)

## 2011-06-04 LAB — PRO B NATRIURETIC PEPTIDE: Pro B Natriuretic peptide (BNP): 2256 pg/mL — ABNORMAL HIGH (ref 0–450)

## 2011-06-04 MED ORDER — LEVOTHYROXINE SODIUM 100 MCG IV SOLR
44.0000 ug | Freq: Every day | INTRAVENOUS | Status: DC
  Administered 2011-06-04: 44 ug via INTRAVENOUS
  Administered 2011-06-05: 09:00:00 via INTRAVENOUS
  Administered 2011-06-06 – 2011-06-07 (×2): 44 ug via INTRAVENOUS
  Administered 2011-06-08: 10:00:00 via INTRAVENOUS
  Administered 2011-06-09: 44 ug via INTRAVENOUS
  Filled 2011-06-04 (×10): qty 2.2

## 2011-06-04 MED ORDER — MORPHINE SULFATE 2 MG/ML IJ SOLN
1.0000 mg | INTRAMUSCULAR | Status: DC | PRN
  Administered 2011-06-04: 1 mg via INTRAVENOUS

## 2011-06-04 MED ORDER — PANTOPRAZOLE SODIUM 40 MG IV SOLR
40.0000 mg | Freq: Every day | INTRAVENOUS | Status: DC
  Administered 2011-06-04 – 2011-06-09 (×6): 40 mg via INTRAVENOUS
  Filled 2011-06-04 (×7): qty 40

## 2011-06-04 MED ORDER — NIFEDIPINE 10 MG PO CAPS
10.0000 mg | ORAL_CAPSULE | Freq: Two times a day (BID) | ORAL | Status: DC
  Administered 2011-06-04 (×2): 10 mg via SUBLINGUAL
  Filled 2011-06-04 (×5): qty 1

## 2011-06-04 MED ORDER — LORAZEPAM 2 MG/ML IJ SOLN
0.5000 mg | Freq: Four times a day (QID) | INTRAMUSCULAR | Status: DC | PRN
  Administered 2011-06-04 – 2011-06-08 (×5): 1 mg via INTRAVENOUS
  Administered 2011-06-09: 0.5 mg via INTRAVENOUS
  Administered 2011-06-10: 1 mg via INTRAVENOUS
  Filled 2011-06-04 (×9): qty 1

## 2011-06-04 MED ORDER — MORPHINE SULFATE 2 MG/ML IJ SOLN
INTRAMUSCULAR | Status: AC
  Administered 2011-06-04: 1 mg via INTRAVENOUS
  Filled 2011-06-04: qty 1

## 2011-06-04 MED ORDER — WARFARIN SODIUM 3 MG PO TABS
3.0000 mg | ORAL_TABLET | Freq: Once | ORAL | Status: DC
  Filled 2011-06-04: qty 1

## 2011-06-04 MED ORDER — LORAZEPAM 0.5 MG PO TABS: 0.5000 mg | ORAL_TABLET | Freq: Four times a day (QID) | ORAL | Status: DC | PRN

## 2011-06-04 MED ORDER — LEVOTHYROXINE SODIUM 100 MCG IV SOLR: 44.0000 ug | Freq: Every day | INTRAVENOUS | Status: DC

## 2011-06-04 MED ORDER — LORAZEPAM 2 MG/ML PO CONC: 0.5000 mg | Freq: Four times a day (QID) | ORAL | Status: DC | PRN

## 2011-06-04 NOTE — Consult Note (Signed)
I have reviewed Michelle Hopkins 's note examined the patient and agree with plan of treatment. It would be very dissicult or impossible at this point to pass the endoscope safely through the esophagus since it appears to be packed with barium and food. She has a dilated atonic presbyesophagus with possible LES spasm/stricture. We will try to  Evacuate some of the debris with sips of clear liquids which she is tolerating, we added Procardia to relax LES. If the barium moves down by tomorrow morning, we would either attempt  EGD or place NG tube and irrigate her esophagus gently. Ideally, intubation in OR and lavage of the esophagus with Ewald tube would be very effective but she is too frail for that.In the long run she will need some strict diet modifications suggested by [employer] ([employer])

## 2011-06-04 NOTE — Progress Notes (Signed)
ANTICOAGULATION CONSULT NOTE - Follow Up Consult  Pharmacy Consult for coumadin Indication: atrial fibrillation  Allergies  Allergen Reactions  . Avelox (Moxifloxacin Hcl In Nacl)   . Canarium     Unknown reaction  . Codeine   . Latex     Rash   . Neosporin (Neomycin-Polymyxin B)   . Rice   . Tape     Rips skin    Patient Measurements: Height: 5\' 2"  (157.5 cm) Weight: 89 lb 8.1 oz (40.6 kg) IBW/kg (Calculated) : 50.1   Vital Signs: Temp: 98 F (36.7 C) (01/10 0500) Temp src: Oral (01/10 0500) BP: 123/66 mmHg (01/10 0500) Pulse Rate: 106  (01/10 0500)  Labs:  Basename 06/04/11 0550 06/03/11 0605 06/02/11 0358  HGB 13.9 12.6 --  HCT 42.5 38.7 37.2  PLT 368 315 299  APTT -- -- --  LABPROT 24.8* 29.7* 22.5*  INR 2.20* 2.77* 1.94*  HEPARINUNFRC -- -- --  CREATININE 0.67 0.66 0.63  CKTOTAL -- -- --  CKMB -- -- --  TROPONINI -- -- --   Estimated Creatinine Clearance: 34.7 ml/min (by C-G formula based on Cr of 0.67).   Medications:  Scheduled:    . antiseptic oral rinse  15 mL Mouth Rinse BID  . azithromycin  500 mg Intravenous Q24H  . cefTRIAXone (ROCEPHIN)  IV  1 g Intravenous Q24H  . feeding supplement  237 mL Oral BID BM  . Fluticasone-Salmeterol  1 puff Inhalation BID  . gabapentin  300 mg Oral TID  . levalbuterol  0.63 mg Nebulization Q6H  . levothyroxine  44 mcg Intravenous QAC breakfast  . mirtazapine  15 mg Oral QHS  . pantoprazole (PROTONIX) IV  40 mg Intravenous Daily  . tiotropium  18 mcg Inhalation Daily  . DISCONTD: diltiazem  180 mg Oral Daily  . DISCONTD: levothyroxine  88 mcg Oral QAC breakfast  . DISCONTD: omeprazole  20 mg Oral Q breakfast    Assessment: 84 yof with atrial fibrillation on coumadin. INR at goal today, CBC stable, No bleeding noted.  Goal of Therapy:  INR = 2-2.5  Plan:  1. Coumadin 3mg  po x 1 2. F/u INR in am  Riki Rusk 06/04/2011,11:44 AM

## 2011-06-04 NOTE — Progress Notes (Signed)
Name: Michelle Hopkins MRN: [id number] DOB: 1926-07-04    LOS: 8  [facility]  History of Present Illness:  78 year old female presented to PCP on 1/2 with CC: 2-3 weeks of weakness and fatigue. Dx eval demonstrated AF w/ RVR, and abnormal CXR (bilateral infiltrates). She was admitted for further eval. RX included broad spectrum antibiotics, oxygen and cards consult for RVR . On 1/7 she was noted to have progressive bilateral airspace disease and effusions. Pulmonary asked to evaluate.     Cultures: MRSA 1/2: negative Urine strep antigen 1/7>>> negative Urine legionella antigen 1/7>>>neg Procalcitonin 1/7>>> <0.10  Antibiotics: Imipenem (PNA) 1/2>>>1/7 vanc (progressive PNA) 1/6>>>1/7 Azithromycin 1/7>>> ceftraixne 1/7>>>   Tests / Events: Echo: 1/3: Left ventricle: The cavity size was normal. Wall thickness was normal. Systolic function was normal. The estimated ejection fraction was in the range of 55% to 60%. Wall motion was normal; there were no regional wall motion abnormalities. The study is not technically sufficient to allow evaluation of LV diastolic function.- Mitral valve: Moderate regurgitation- Left atrium: The atrium was moderately dilated. Right ventricle: The cavity size was mildly dilated.- Right atrium: The atrium was moderately dilated.- Tricuspid valve: Severe regurgitation.  Pulmonary arteries: Systolic pressure was severely increased. ESR 1/7>>> 27 Pro-BNP 1/7>>> 2354 Bedside US: bilateral effusions. Mod size.  Leg ultrasound: no DVT Overnight events: This morning after breakfast she has had a lot of vomiting and coughing.        Vital Signs: Temp:  [97.7 F (36.5 C)-98 F (36.7 C)] 98 F (36.7 C) (01/10 0500) Pulse Rate:  [76-106] 106  (01/10 0500) Resp:  [18-24] 24  (01/10 0500) BP: (109-123)/(66-69) 123/66 mmHg (01/10 0500) SpO2:  [91 %-100 %] 92 % (01/10 0813)    I/O last 3 completed shifts: In: -  Out: 600  [Urine:600]  Physical Examination: Gen: frail elderly female breathing comfortably today HEENT: NCAT, PERRL, EOMi, MM dry PULM: Diminished R base, insp crackles L base CV: RRR, no mgr, no JVD AB: soft, nontender, no hsm Ext: warm, no edema, no clubbing, no cyanosis Derm: thin skin with scattered bruises Neuro: A&Ox4, CN II-XII intact, strength 5/5 in all 4 extremities   Labs and Imaging:   Lab 06/04/11 0550 06/03/11 0605 06/02/11 0358  NA 131* 133* 136  K 4.3 4.9 4.1  CL 91* 95* 94*  CO2 31 33* 37*  BUN 23 20 22   CREATININE 0.67 0.66 0.63  GLUCOSE 89 80 94    Lab 06/04/11 0550 06/03/11 0605 06/02/11 0358  HGB 13.9 12.6 11.9*  HCT 42.5 38.7 37.2  WBC 13.4* 13.0* 11.6*  PLT 368 315 299   PCXR: 1/9 changes c/w COPD, bilateral airspace disease.  Small L effusion   Assessment and Plan: Hypoxic respiratory failure in the setting of bilateral pulmonary infiltrates w/ effusions, volume overload, and basilar consolidation.  It is not surprising at all that she is hypoxemic considering the amount of emphysema on CXR in addition to airflow limitation, restriction (kyphosis) and volume overload.  She has been volume up after a-fib with rvr, may have also aspirated given basilar infiltrates and history of aspirating. Significant nausea and vomiting am of 1/9.  If she does not have oropharyngeal aspiration that she has esophageal dysmotility or an obstruction of some sort leading to recurrent aspiration.  Lab 06/04/11 0550 06/03/11 0605 06/02/11 0358  WBC 13.4* 13.0* 11.6*   Pro B Natriuretic peptide (BNP)  Date Value Range Status  06/04/2011 2256.0* 0-450 (pg/mL) Final  Rec/plan:  -Esophogram/Barium swallow with near complete obstruction of the distal esophagus with a large  amount of debris and food material within the dilated esophagus. Only a small amount of contrast passes into the stomach. Recommends complete NPO with GI evaluation.   -QID oral care-not tolerating well.   -continue antibiotics as written for 10 day course (I spoke with pharmacy yesterday and asked that they extend this given yesterday's respiratory status, given that she has received 7 days and still was having respiratory difficulty I would extend the course to 10 days) -do not feel that she needs diuresis today -No indication for thoracentesis at this time.    ID -speech evaluation for aspiration -continue ceftriaxone and azithro  Dispo:  - DNR -Pending evaluation of swallowing / ? Stricture per GI prior to d/c  Canary Brim, NP-C [facility] Pgr: [phone] Macai Sisneros, Riley Lam

## 2011-06-04 NOTE — Progress Notes (Signed)
Was notified by family that Dr.Brodi wanted pt to take sips of water every 10 minutes; verified this with Lucrezia Europe. Was told that if pt able to tolerate small sips and keep it down then to continue, however if pt throwing it back up, then to stop. Pt and pt family made aware of this. As of right now, pt had not been able to keep anything down. Pt and family instructed to not take anymore sips. Will continue to monitor.

## 2011-06-04 NOTE — Progress Notes (Signed)
Patient Name: Michelle Hopkins      SUBJECTIVE: feelling better  Antibiotics adjusted per IM and pukmonary;  Barium swallow >>distal obstruction eval pending; pulmonary process maybe recurrent aspiration;  GI eval pending Past Medical History  Diagnosis Date  . COPD (chronic obstructive pulmonary disease)   . HTN (hypertension)   . Hypothyroidism   . Osteoporosis   . Postherpetic neuralgia     PHYSICAL EXAM Filed Vitals:   06/03/11 1526 06/03/11 2238 06/04/11 0324 06/04/11 0500  BP:    123/66  Pulse:    106  Temp:    98 F (36.7 C)  TempSrc:    Oral  Resp:    24  Height:      Weight:      SpO2: 94% 91% 94% 100%  BP 123/66  Pulse 106  Temp(Src) 98 F (36.7 C) (Oral)  Resp 24  Ht 5\' 2"  (1.575 m)  Wt 89 lb 8.1 oz (40.6 kg)  BMI 16.37 kg/m2  SpO2 100%  Temp (24hrs), Avg:97.9 F (36.6 C), Min:97.7 F (36.5 C), Max:98 F (36.7 C)  Wt down 10 lbs since admission  Well developed and nourished in mild respiratory distress  HENT normal Neck supple with JVP-flat Carotids brisk and full without bruits Wheezes and rhonchi bilaterally decreased breath sounds left Irregularly irregular rate and rhythm with rapid   ventricular response, no murmurs or gallops Abd-soft with active BS without hepatomegaly No Clubbing cyanosis edema Skin-warm and dry A & Oriented  Grossly normal sensory and motor function  TELEMETRY: Reviewed telemetry pt in af with  VR now in the 90s  Intake/Output Summary (Last 24 hours) at 06/04/11 0724 Last data filed at 06/04/11 0400  Gross per 24 hour  Intake      0 ml  Output    600 ml  Net   -600 ml  I/O --3L net   LABS: Basic Metabolic Panel:  Lab 06/03/11 1610 06/02/11 0358 06/01/11 0717 05/31/11 0545 05/30/11 0600 05/29/11 0630  NA 133* 136 138 141 140 137  K 4.9 4.1 3.7 3.8 4.2 3.9  CL 95* 94* 96 100 105 105  CO2 33* 37* 33* 34* 30 27  GLUCOSE 80 94 88 94 96 101*  BUN 20 22 18 18 19 19   CREATININE 0.66 0.63 0.55 0.55 0.50 0.51    CALCIUM 8.7 8.4 -- -- -- --  MG -- -- -- -- -- --  PHOS -- -- -- -- -- --   Cardiac Enzymes: No results found for this basename: CKTOTAL:3,CKMB:3,CKMBINDEX:3,TROPONINI:3 in the last 72 hours CBC:  Lab 06/04/11 0550 06/03/11 0605 06/02/11 0358 06/01/11 0717 05/31/11 0545 05/30/11 0600 05/29/11 0630  WBC 13.4* 13.0* 11.6* 9.3 10.3 8.6 11.1*  NEUTROABS -- -- -- -- -- -- --  HGB 13.9 12.6 11.9* 11.9* 11.5* 11.3* 11.4*  HCT 42.5 38.7 37.2 37.3 35.7* 35.5* 35.3*  MCV 86.0 86.6 86.9 87.1 87.9 89.2 87.6  PLT 368 315 299 272 265 241 212   PROTIME:  Basename 06/03/11 0605 06/02/11 0358  LABPROT 29.7* 22.5*  INR 2.77* 1.94*   Liver Function Tests:  Basename 06/03/11 0605 06/02/11 0358  AST 40* 28  ALT 41* 32  ALKPHOS 130* 137*  BILITOT 0.2* 0.2*  PROT 5.4* 5.3*  ALBUMIN 2.4* 2.1*   BNP 2760  ASSESSMENT AND PLAN:  Patient Active Hospital Problem List: Pneumonia (05/27/2011)  Esophageal obstruction (06/04/2011)   Atrial fibrillation (05/27/2011)  Acute on chronic diastolic heart failure (06/01/2011)  Elevated transaminase level (  05/29/2011)        She continues to have atrial fibrillation in the setting of her pneumonia with a rapid but improved ventricular response i think there is a component of HFPEF ; we gave her one more dose of diuretics but I suspect the increasing hear rates are a sign now of intravascular depletion so will stop diuretics AF HR now abuout 100  Given her NPO, i have taken the liberty of increasing saline for now until teh feeding issue is resolved.. I suspect as above that she is relatively dehydrated esp with inability to take PO the last 24 hrs  From AF perspective she is on coumadin   (per pharmacy so NOT on MAR)      Signed, Sherryl Manges MD  06/04/2011    Signed, Sherryl Manges MD  06/04/2011     Signed, Sherryl Manges MD  06/04/2011

## 2011-06-04 NOTE — Progress Notes (Signed)
PT Cancellation Note  Treatment cancelled today due to medical issues with patient which prohibited therapy.  Patient with decreased SaO2 at rest.  RN advised to cancel PT for today.  Shelbie Franken,CYNDI 06/04/2011, 5:21 PM

## 2011-06-04 NOTE — Consult Note (Cosign Needed)
[facility] Consultation  Referring Provider: CCM Kendrick Fries) Primary Care Physician:  Gaspar Garbe, MD, MD Primary Gastroenterologist:   none Reason for Consultation:  Dysphagia  HPI: Michelle Hopkins is a 78 y.o. female with a history of SVT, possible TIA, COPD and HTN. She is on chronic coumadin. Patient admitted by PCP 05/27/11 for anorexia, malaise and difficulty breathing, abnormal CXR and rapid a-fib.  Patient now being treated for multilobar PNA. She is having some mild confusion this am which is new according to family members. History mainly comes from daughter and daughter-in-law. According to the daughter, patient has been having solid food dysphagia for a few months now. She apparently had rapid progression of dysphagia over the last few day. Despite being extremely thin,family doesn't think she has had any recent weight loss. Patient has chronic GERD and symptoms have been controlled on Prilosec at home. Patient denies abdominal pain or problems with bowel movements. She reports nausea and vomiting but sounds like she is spitting up sputum rather than having true nausea and vomiting.    Past Medical History  Diagnosis Date  . COPD (chronic obstructive pulmonary disease)   . HTN (hypertension)   . Hypothyroidism   . Osteoporosis   . Postherpetic neuralgia     Past Surgical History  Procedure Date  . Appendectomy 1996  . Left hip repair s/p fracture 2004    Prior to Admission medications   Medication Sig Start Date End Date Taking? Authorizing Provider  beta carotene w/minerals (OCUVITE) tablet Take 1 tablet by mouth daily.     Yes Historical Provider, MD  cefdinir (OMNICEF) 300 MG capsule Take 300 mg by mouth 2 (two) times daily as needed. For shingles    Yes Historical Provider, MD  Fluticasone-Salmeterol (ADVAIR) 250-50 MCG/DOSE AEPB Inhale 1 puff into the lungs daily.     Yes Historical Provider, MD  gabapentin (NEURONTIN) 300 MG capsule Take 300 mg by mouth  daily as needed. For nerve pain from shingles    Yes Historical Provider, MD  levothyroxine (SYNTHROID, LEVOTHROID) 88 MCG tablet Take 88 mcg by mouth daily.     Yes Historical Provider, MD  mirtazapine (REMERON) 15 MG tablet Take 15 mg by mouth at bedtime.     Yes Historical Provider, MD  omeprazole (PRILOSEC) 20 MG capsule Take 20 mg by mouth daily.     Yes Historical Provider, MD  tiotropium (SPIRIVA) 18 MCG inhalation capsule Place 18 mcg into inhaler and inhale daily.     Yes Historical Provider, MD    Current Facility-Administered Medications  Medication Dose Route Frequency Provider Last Rate Last Dose  . 0.9 %  sodium chloride infusion   Intravenous Continuous Duke Salvia, MD 75 mL/hr at 06/04/11 0751    . antiseptic oral rinse (BIOTENE) solution 15 mL  15 mL Mouth Rinse BID Gaspar Garbe, MD   15 mL at 06/02/11 0815  . azithromycin (ZITHROMAX) 500 mg in dextrose 5 % 250 mL IVPB  500 mg Intravenous Q24H Max Fickle, MD   500 mg at 06/03/11 1453  . cefTRIAXone (ROCEPHIN) 1 g in dextrose 5 % 50 mL IVPB  1 g Intravenous Q24H Max Fickle, MD   1 g at 06/03/11 1453  . feeding supplement (ENSURE CLINICAL STRENGTH) liquid 237 mL  237 mL Oral BID BM Hettie Holstein, RD   237 mL at 06/02/11 1823  . Fluticasone-Salmeterol (ADVAIR) 250-50 MCG/DOSE inhaler 1 puff  1 puff Inhalation BID Rosalio Loud [place]   1  puff at 06/04/11 0806  . gabapentin (NEURONTIN) capsule 300 mg  300 mg Oral TID Rosalio [place]   300 mg at 06/02/11 2113  . levalbuterol (XOPENEX) nebulizer solution 0.63 mg  0.63 mg Nebulization Q6H Duke Salvia, MD   0.63 mg at 06/04/11 0807  . levothyroxine (SYNTHROID, LEVOTHROID) injection 44 mcg  44 mcg Intravenous Daily Gaspar Garbe, MD      . mirtazapine (REMERON SOL-TAB) disintegrating tablet 15 mg  15 mg Oral QHS Rosalio [place]   15 mg at 06/02/11 2113  . ondansetron (ZOFRAN) injection 4 mg  4 mg Intravenous Q6H PRN Kari Baars, MD   4 mg at  06/03/11 0926  . pantoprazole (PROTONIX) injection 40 mg  40 mg Intravenous Q24H Gaspar Garbe, MD      . tiotropium [facility]) inhalation capsule 18 mcg  18 mcg Inhalation Daily Rosalio [place]   18 mcg at 06/04/11 0806  . DISCONTD: diltiazem (CARDIZEM CD) 24 hr capsule 180 mg  180 mg Oral Daily Duke Salvia, MD   180 mg at 06/02/11 1020  . DISCONTD: levothyroxine (SYNTHROID, LEVOTHROID) tablet 88 mcg  88 mcg Oral QAC breakfast Rosalio [place]   88 mcg at 06/03/11 0636  . DISCONTD: omeprazole (PRILOSEC) capsule 20 mg  20 mg Oral Q breakfast Garth Bigness [place], PHARMD   20 mg at 06/02/11 1610    Allergies as of 05/27/2011 - Review Complete 05/27/2011  Allergen Reaction Noted  . Avelox (moxifloxacin hcl in nacl)  05/27/2011  . Canarium  05/27/2011  . Codeine  05/27/2011  . Latex  05/27/2011  . Neosporin (neomycin-polymyxin b)  05/27/2011  . Rice  05/27/2011  . Tape  05/27/2011    Family History  Problem Relation Age of Onset  . Colon cancer Father   . Huntington's disease Mother   . Coronary artery disease Brother   . Melanoma Sister     History   Social History  . Marital Status: Widowed    Spouse Name: N/A    Number of Children: N/A  . Years of Education: N/A   Occupational History  . Not on file.   Social History Main Topics  . Smoking status: Former Smoker -- 1.0 packs/day for 20 years    Types: Cigarettes  . Smokeless tobacco: Not on file  . Alcohol Use: No  . Drug Use: No  . Sexually Active: Not on file   Other Topics Concern  . Not on file   Social History Narrative   Lives in [place], [place] alone. Widowed. 1 daughter.     Review of Systems: Chronic shortness of breath with minimal activity. All other systems reviewed and negative except where noted in HPI.  PHYSICAL EXAM: Vital signs in last 24 hours: Temp:  [97.7 F (36.5 C)-98 F (36.7 C)] 98 F (36.7 C) (01/10 0500) Pulse Rate:  [76-106] 106  (01/10 0500) Resp:  [18-24] 24  (01/10  0500) BP: (109-123)/(66-69) 123/66 mmHg (01/10 0500) SpO2:  [91 %-100 %] 92 % (01/10 0813) Last BM Date: 06/03/11 General:   Pleasant, emaciated white female in NAD Head:  Normocephalic and atraumatic. Eyes:   No icterus.   Conjunctiva pale Ears:  Slightly hard or hearing.  Neck:  Supple; no masses felt Lungs:  Respirations slightly labored even with 02 per [place]. No wheezing but breath sounds diminished bilaterally.   Heart:  Irregular rhythm, slightly tachycardic.  Abdomen:  Soft, nondistended, nontender. Normal bowel sounds. No appreciable  masses or hepatomegaly.  Rectal:  Not performed.  Msk:  Symmetrical without gross deformities.  Extremities:  Without edema. Neurologic:  Alert and oriented to person. Behavior appropriate but she has some mild confusion.  Skin:  Brown discoloration of bilateral lower extremities. Scattered bruising bilateral upper extremities. Cervical Nodes:  No significant cervical adenopathy. Psych:  Alert and cooperative. Normal affect.  LAB RESULTS:  Basename 06/04/11 0550 06/03/11 0605 06/02/11 0358  WBC 13.4* 13.0* 11.6*  HGB 13.9 12.6 11.9*  HCT 42.5 38.7 37.2  PLT 368 315 299   BMET  Basename 06/04/11 0550 06/03/11 0605 06/02/11 0358  NA 131* 133* 136  K 4.3 4.9 4.1  CL 91* 95* 94*  CO2 31 33* 37*  GLUCOSE 89 80 94  BUN 23 20 22   CREATININE 0.67 0.66 0.63  CALCIUM 8.4 8.7 8.4   LFT  Basename 06/04/11 0550  PROT 5.9*  ALBUMIN 2.7*  AST 47*  ALT 48*  ALKPHOS 141*  BILITOT 0.3  BILIDIR --  IBILI --   PT/INR  Basename 06/04/11 0550 06/03/11 0605  LABPROT 24.8* 29.7*  INR 2.20* 2.77*    STUDIES: Dg Chest 2 View  06/02/2011  *RADIOLOGY REPORT*  Clinical Data: Short of breath  CHEST - 2 VIEW  Comparison: 05/31/2011  Findings: Severe COPD with scarring.  Improved aeration of the lung bases.  Improvement in bilateral effusions.  Vascular congestion has resolved.  IMPRESSION: Improving congestive heart failure with bilateral effusions.   Underlying pneumonia not excluded but the findings may all be due to heart failure.  Original Report Authenticated By: Camelia Phenes, M.D.   Dg Esophagus  06/03/2011  *RADIOLOGY REPORT*  Clinical Data: Regurgitation, vomiting.  Question stricture.  ESOPHOGRAM/BARIUM SWALLOW  Technique:  Single contrast examination was performed using thin barium.  Fluoroscopy time:  1.46 minutes.  Comparison:  CT 05/27/2011  Findings:  The esophagus is nearly completely obstructed distally. Only a small amount of contrast material passes beyond a distal obstruction and into the stomach.  Extensive food material is seen within the dilated esophagus.  Large filling defects noted distally in the esophagus.  It is unknown whether this represents a mass or possibly impacted food at a severe distal esophageal stricture.  IMPRESSION: Near complete obstruction of the distal esophagus with a large amount of debris and food material within the dilated esophagus. Only a small amount of contrast passes into the stomach.  Recommend endoscopy for further evaluation.  These results were called to Dr. Wylene Simmer at the time of interpretation.  Original Report Authenticated By: Cyndie Chime, M.D.   Dg Chest Port 1 View  06/03/2011  *RADIOLOGY REPORT*  Clinical Data: Nausea, vomiting, low oxygen saturation  PORTABLE CHEST - 1 VIEW  Comparison: Portable exam 1057 hours compared to 06/02/2011  Findings: Upper-normal size of cardiac silhouette. Prominent central pulmonary arteries stable. Mediastinal contours normal. Severe emphysematous changes. Chronic bibasilar opacities left greater than right. Minimal perihilar infiltrates/edema. Small left pleural effusion. No pneumothorax. Diffuse osseous demineralization.  IMPRESSION: COPD with suspect mild CHF. Bibasilar opacities, particularly on left, either representing consolidation versus atelectasis left lower lobe. Small left pleural effusion.  Original Report Authenticated By: Lollie Marrow, M.D.      PREVIOUS ENDOSCOPIES: Possibly a colonoscopy greater than 10 years ago by unknown physician.    IMPRESSION / PLAN: 54. 78 year old white female with progressive solid food dysphagia, barium swallow shows distal esophagus to be almost completely obstructed and filled with food. Rule out neoplasm.  This could also be food impaction secondary to severe dysmotility. This is difficult situation. Patient is at increased risk for upper endoscopy secondary to pulmonary issues. Furthermore it would be difficult and risky to try and remove all the food endoscopically.Will try small dose of sublingual Procardia to try and relax her lower esophageal sphincter to facilitate passage of retained food. Will get a follow up CXR in am. If no improvement may need to place NGT under floroscopy in an attempt to suction out some of the debris. Of course this procedure is risky as well as it could lead to aspiration. The family understands the risks of intervention as well as the risk of not intervening at all.  2.Hypoxic respiratory failure / bilateral pulmonary infiltrates with effusion. She is on antibiotics. Diuretics were on hold secondary to ? Hypovolemia related to #1.  3. A-Fib, on Coumadin. 4. COPD 5. Malnutrition, will need to address nutrition in very near future. Patient already malnourished.   6. [facility] of colon cancer in father.  Thanks   LOS: 8 days   Willette Cluster  06/04/2011, 11:39 AM

## 2011-06-04 NOTE — Progress Notes (Signed)
Subjective: Doing ok overnight.  Discussed her upcoming esophageal investigation with family and reviewed the findings from yesterday's barium swallow.  Objective: Vital signs in last 24 hours: Temp:  [97.7 F (36.5 C)-98 F (36.7 C)] 98 F (36.7 C) (01/10 0500) Pulse Rate:  [76-106] 106  (01/10 0500) Resp:  [18-24] 24  (01/10 0500) BP: (109-123)/(66-69) 123/66 mmHg (01/10 0500) SpO2:  [91 %-100 %] 100 % (01/10 0500) Weight change:  Last BM Date: 06/03/11  Intake/Output from previous day: 01/09 0701 - 01/10 0700 In: -  Out: 600 [Urine:600] Intake/Output this shift:   General appearance: alert, cooperative, appears stated age and no distress  Resp: better aeration, no adventitia  Chest wall: no tenderness  Cardio: irregularly irregular rhythm  GI: soft, non-tender; bowel sounds normal; no masses, no organomegaly  Extremities: extremities normal, atraumatic, no cyanosis or edema    Lab Results:  [facility] 06/04/11 0550 06/03/11 0605  WBC 13.4* 13.0*  HGB 13.9 12.6  HCT 42.5 38.7  PLT 368 315   BMET  Basename 06/04/11 0550 06/03/11 0605  NA 131* 133*  K 4.3 4.9  CL 91* 95*  CO2 31 33*  GLUCOSE 89 80  BUN 23 20  CREATININE 0.67 0.66  CALCIUM 8.4 8.7    Studies/Results: Dg Chest 2 View  06/02/2011  *RADIOLOGY REPORT*  Clinical Data: Short of breath  CHEST - 2 VIEW  Comparison: 05/31/2011  Findings: Severe COPD with scarring.  Improved aeration of the lung bases.  Improvement in bilateral effusions.  Vascular congestion has resolved.  IMPRESSION: Improving congestive heart failure with bilateral effusions.  Underlying pneumonia not excluded but the findings may all be due to heart failure.  Original Report Authenticated By: Camelia Phenes, M.D.   Dg Esophagus  06/03/2011  *RADIOLOGY REPORT*  Clinical Data: Regurgitation, vomiting.  Question stricture.  ESOPHOGRAM/BARIUM SWALLOW  Technique:  Single contrast examination was performed using thin barium.  Fluoroscopy time:   1.46 minutes.  Comparison:  CT 05/27/2011  Findings:  The esophagus is nearly completely obstructed distally. Only a small amount of contrast material passes beyond a distal obstruction and into the stomach.  Extensive food material is seen within the dilated esophagus.  Large filling defects noted distally in the esophagus.  It is unknown whether this represents a mass or possibly impacted food at a severe distal esophageal stricture.  IMPRESSION: Near complete obstruction of the distal esophagus with a large amount of debris and food material within the dilated esophagus. Only a small amount of contrast passes into the stomach.  Recommend endoscopy for further evaluation.  These results were called to Dr. Wylene Simmer at the time of interpretation.  Original Report Authenticated By: Cyndie Chime, M.D.   Dg Chest Port 1 View  06/03/2011  *RADIOLOGY REPORT*  Clinical Data: Nausea, vomiting, low oxygen saturation  PORTABLE CHEST - 1 VIEW  Comparison: Portable exam 1057 hours compared to 06/02/2011  Findings: Upper-normal size of cardiac silhouette. Prominent central pulmonary arteries stable. Mediastinal contours normal. Severe emphysematous changes. Chronic bibasilar opacities left greater than right. Minimal perihilar infiltrates/edema. Small left pleural effusion. No pneumothorax. Diffuse osseous demineralization.  IMPRESSION: COPD with suspect mild CHF. Bibasilar opacities, particularly on left, either representing consolidation versus atelectasis left lower lobe. Small left pleural effusion.  Original Report Authenticated By: Lollie Marrow, M.D.    Medications:  I have reviewed the patient's current medications. Scheduled:   . antiseptic oral rinse  15 mL Mouth Rinse BID  . azithromycin  500  mg Intravenous Q24H  . cefTRIAXone (ROCEPHIN)  IV  1 g Intravenous Q24H  . feeding supplement  237 mL Oral BID BM  . Fluticasone-Salmeterol  1 puff Inhalation BID  . furosemide  40 mg Intravenous Once  .  gabapentin  300 mg Oral TID  . levalbuterol  0.63 mg Nebulization Q6H  . levothyroxine  88 mcg Oral QAC breakfast  . mirtazapine  15 mg Oral QHS  . omeprazole  20 mg Oral Q breakfast  . tiotropium  18 mcg Inhalation Daily  . DISCONTD: diltiazem  180 mg Oral Daily   Continuous:   . sodium chloride 75 mL/hr at 06/04/11 0751   [id number] (ZOFRAN) IV  Assessment/Plan: 1) Multilobar Pneumonia. Has completed a 7 day course of abx from her admission on 1/10 and was d/ced yesterday, but restarted.  Not sure if there is a different point of 7 days that is being used by Pulm.  WIll continue for now.   2) Afib. Rate controlled on Cardizem CD 3) Osteoporosis: As above.  4) Weight loss. Eating well at this point, added supplements earlier in her hospitalization She had stricture seen in her distal esophagus and her PNA may be related to chemical injury when food "stacks" in her esophagus.  I spoke with Dr. Russella Dar who will be seeing her today.  Likely EGD and biopsy if necessary.  If needs dilatation, will need to come off anticoagulation and may be needing to do Monday if needed. Marland Kitchen5) HTN Controlled, Cardizem as well.  6) WBC Normalized  7) Herpetic neuralgia: Continue Neurontin.  8) PT/OT incentive spirometer.     LOS: 8 days   Trichelle Lehan W 06/04/2011, 8:08 AM

## 2011-06-04 NOTE — Progress Notes (Signed)
OT Cancellation Note  Treatment cancelled today due to medical issues with patient which prohibited therapy. Pt with decreased O2 sats at rest per RN upon second attempt to see pt.  Unable to tolerate any activity.  Will continue to follow.  Evern Bio 06/04/2011, 2:25 PM

## 2011-06-04 NOTE — Progress Notes (Signed)
Speech Pathology: DysphagiaTreatment Note  Subjective:  Awake, alert, laying in bed with daughter at bedside.  Objective:  Treatment focused on education regarding QID oral care now that she is NPO due to distal esophageal stricture.  Initially demonstrated oral care products and procedure, then patient carried out her own oral care, although only briefly due to fatigue. Immediately following oral care, patient began to regurgitate white, frothy secretions, likely a result of peristaltic wave affect from possible swallows during oral care.  Patient tentative to continue oral care thereafter due to unpleasant experience with regurgititation. Again, SLP feels patient has a functional oral-pharyngeal swallow. Await GI input on esophageal component and will likely sign off thereafter.  Assessment: Brief tolerance of oral care.  Recommendations:  1.  Will follow and await GI input  Pain:   none Intervention Required:   No  Goals: No Goals Met  Myra Rude, M.S.,CCC-SLP Pager [phone]

## 2011-06-04 NOTE — Progress Notes (Signed)
OT Cancellation Note  Treatment cancelled today due to medical issues with patient which prohibited therapy and patient's refusal to participate.  Pt reported vomiting after oral care with ST just prior to OTs arrival..  Politely declined OT.  Will continue to follow. Evern Bio 06/04/2011, 10:00 AM [phone]

## 2011-06-05 ENCOUNTER — Inpatient Hospital Stay (HOSPITAL_COMMUNITY): Payer: Medicare Other

## 2011-06-05 LAB — COMPREHENSIVE METABOLIC PANEL
ALT: 38 U/L — ABNORMAL HIGH (ref 0–35)
AST: 29 U/L (ref 0–37)
CO2: 27 mEq/L (ref 19–32)
Calcium: 8 mg/dL — ABNORMAL LOW (ref 8.4–10.5)
GFR calc non Af Amer: 85 mL/min — ABNORMAL LOW (ref >90)
Sodium: 135 mEq/L (ref 135–145)
Total Protein: 5.4 g/dL — ABNORMAL LOW (ref 6.0–8.3)

## 2011-06-05 LAB — CBC
MCH: 27.8 pg (ref 26.0–34.0)
Platelets: 315 10*3/uL (ref 150–400)
RBC: 4.67 MIL/uL (ref 3.87–5.11)
WBC: 13.5 10*3/uL — ABNORMAL HIGH (ref 4.0–10.5)

## 2011-06-05 MED ORDER — LEVALBUTEROL HCL 0.63 MG/3ML IN NEBU
0.6300 mg | INHALATION_SOLUTION | Freq: Four times a day (QID) | RESPIRATORY_TRACT | Status: DC
  Administered 2011-06-06 – 2011-06-08 (×10): 0.63 mg via RESPIRATORY_TRACT
  Filled 2011-06-05 (×19): qty 3

## 2011-06-05 MED ORDER — DILTIAZEM LOAD VIA INFUSION
10.0000 mg | Freq: Once | INTRAVENOUS | Status: DC
  Administered 2011-06-05: 10 mg via INTRAVENOUS
  Filled 2011-06-05: qty 10

## 2011-06-05 MED ORDER — LIDOCAINE 5 % EX PTCH
1.0000 | MEDICATED_PATCH | CUTANEOUS | Status: DC
  Administered 2011-06-05 – 2011-06-10 (×6): 1 via TRANSDERMAL
  Filled 2011-06-05 (×8): qty 1

## 2011-06-05 MED ORDER — DILTIAZEM HCL 100 MG IV SOLR
5.0000 mg/h | INTRAVENOUS | Status: DC
  Administered 2011-06-05 – 2011-06-09 (×7): 5 mg/h via INTRAVENOUS
  Filled 2011-06-05 (×3): qty 100

## 2011-06-05 NOTE — Progress Notes (Signed)
Subjective Had a good night, sipped on water, some regurgitation  after swallowing larger amounts  Objective: Vital signs in last 24 hours: Temp:  [98.3 F (36.8 C)-98.6 F (37 C)] 98.3 F (36.8 C) (01/11 0500) Pulse Rate:  [75-101] 98  (01/11 0500) Resp:  [16-22] 18  (01/11 0500) BP: (89-146)/(48-86) 122/86 mmHg (01/11 0500) SpO2:  [95 %-99 %] 96 % (01/11 0803) Weight:  [39.5 kg (87 lb 1.3 oz)] 39.5 kg (87 lb 1.3 oz) (01/11 0500) Last BM Date: 06/03/11 General:   Alert,  pleasant, cooperative in NAD Eyes:  Sclera clear, no icterus.   Conjunctiva pink. Mouth:  Edentulous ( dentures don't fit) Neck:  Supple; no masses or thyromegaly. Heart:  Regular rate and rhythm; no murmurs, clicks, rubs,  or gallops. Lungs:  No wheezes or rales Abdomen:  Soft, nontender, normal bowl sounds  Msk:  Symmetrical without gross deformities. Normal posture. Extremities:  Without clubbing or edema. Neurologic:  Alert and  oriented x4;  grossly normal neurologically.   Intake/Output from previous day: 01/10 0701 - 01/11 0700 In: 750 [I.V.:750] Out: 300 [Urine:300] Intake/Output this shift:    Lab Results:  Basename 06/05/11 0550 06/04/11 0550 06/03/11 0605  WBC 13.5* 13.4* 13.0*  HGB 13.0 13.9 12.6  HCT 40.4 42.5 38.7  PLT 315 368 315   BMET  Basename 06/05/11 0550 06/04/11 0550 06/03/11 0605  NA 135 131* 133*  K 4.1 4.3 4.9  CL 100 91* 95*  CO2 27 31 33*  GLUCOSE 76 89 80  BUN 22 23 20   CREATININE 0.55 0.67 0.66  CALCIUM 8.0* 8.4 8.7   LFT  Basename 06/05/11 0550  PROT 5.4*  ALBUMIN 2.5*  AST 29  ALT 38*  ALKPHOS 122*  BILITOT 0.3  BILIDIR --  IBILI --   PT/INR  Basename 06/05/11 0550 06/04/11 0550  LABPROT 28.8* 24.8*  INR 2.66* 2.20*   Hepatitis Panel No results found for this basename: HEPBSAG,HCVAB,HEPAIGM,HEPBIGM in the last 72 hours  Studies/Results: Dg Esophagus  06/03/2011  *RADIOLOGY REPORT*  Clinical Data: Regurgitation, vomiting.  Question stricture.   ESOPHOGRAM/BARIUM SWALLOW  Technique:  Single contrast examination was performed using thin barium.  Fluoroscopy time:  1.46 minutes.  Comparison:  CT 05/27/2011  Findings:  The esophagus is nearly completely obstructed distally. Only a small amount of contrast material passes beyond a distal obstruction and into the stomach.  Extensive food material is seen within the dilated esophagus.  Large filling defects noted distally in the esophagus.  It is unknown whether this represents a mass or possibly impacted food at a severe distal esophageal stricture.  IMPRESSION: Near complete obstruction of the distal esophagus with a large amount of debris and food material within the dilated esophagus. Only a small amount of contrast passes into the stomach.  Recommend endoscopy for further evaluation.  These results were called to Dr. Wylene Simmer at the time of interpretation.  Original Report Authenticated By: Cyndie Chime, M.D.   Dg Chest Port 1 View  06/05/2011  *RADIOLOGY REPORT*  Clinical Data: Shortness of breath  PORTABLE CHEST - 1 VIEW  Comparison: 06/03/2011  Findings: Hyperexpansion is consistent with emphysema. The cardiopericardial silhouette is enlarged.  There is bibasilar atelectasis or infiltrate with small bilateral pleural effusions. Contrast and debris is seen in the mid and distal esophagus consistent with esophageal obstruction diagnosed on esophagram 2 days ago. Telemetry leads overlie the chest.  IMPRESSION: Emphysema with bibasilar atelectasis/infiltrate and small bilateral pleural effusions.  Original Report Authenticated  By: ERIC A. MANSELL, M.D.   Dg Chest Port 1 View  06/03/2011  *RADIOLOGY REPORT*  Clinical Data: Nausea, vomiting, low oxygen saturation  PORTABLE CHEST - 1 VIEW  Comparison: Portable exam 1057 hours compared to 06/02/2011  Findings: Upper-normal size of cardiac silhouette. Prominent central pulmonary arteries stable. Mediastinal contours normal. Severe emphysematous changes.  Chronic bibasilar opacities left greater than right. Minimal perihilar infiltrates/edema. Small left pleural effusion. No pneumothorax. Diffuse osseous demineralization.  IMPRESSION: COPD with suspect mild CHF. Bibasilar opacities, particularly on left, either representing consolidation versus atelectasis left lower lobe. Small left pleural effusion.  Original Report Authenticated By: Lollie Marrow, M.D.     ASSESSMENT:   Principal Problem:  *Pneumonia Active Problems:   Atrial fibrillation  Elevated transaminase level  Acute on chronic diastolic heart failure  Esophageal obstruction  Nonspecific (abnormal) findings on radiological and other examination of gastrointestinal tract  Foreign body in esophagus     PLAN:   Esophageal impaction/partial obstruction, follow up CXR pending this morning, then decide on further disposition, I have spoken to pt's son and to Dr Wylene Simmer. She does not seem to be tolerating her SL Procardia     LOS: 9 days   Michelle Hopkins  06/05/2011, 8:26 AM

## 2011-06-05 NOTE — Progress Notes (Signed)
Name: Benicia Bergevin MRN: [id number] DOB: [birth year]    LOS: 9  [facility]  History of Present Illness:  78 year old female presented to PCP on 1/2 with CC: 2-3 weeks of weakness and fatigue. Dx eval demonstrated AF w/ RVR, and abnormal CXR (bilateral infiltrates). She was admitted for further eval. RX included broad spectrum antibiotics, oxygen and cards consult for RVR . On 1/7 she was noted to have progressive bilateral airspace disease and effusions. Pulmonary asked to evaluate.     Cultures: MRSA 1/2: negative Urine strep antigen 1/7>>> negative Urine legionella antigen 1/7>>>neg Procalcitonin 1/7>>> <0.10  Antibiotics: Imipenem (PNA) 1/2>>>1/7 vanc (progressive PNA) 1/6>>>1/7 Azithromycin 1/7>>> ceftraixne 1/7>>>   Tests / Events: Echo: 1/3: Left ventricle: The cavity size was normal. Wall thickness was normal. Systolic function was normal. The estimated ejection fraction was in the range of 55% to 60%. Wall motion was normal; there were no regional wall motion abnormalities. The study is not technically sufficient to allow evaluation of LV diastolic function.- Mitral valve: Moderate regurgitation- Left atrium: The atrium was moderately dilated. Right ventricle: The cavity size was mildly dilated.- Right atrium: The atrium was moderately dilated.- Tricuspid valve: Severe regurgitation.  Pulmonary arteries: Systolic pressure was severely increased. ESR 1/7>>> 27 Pro-BNP 1/7>>> 2354 Bedside US: bilateral effusions. Mod size.  Leg ultrasound: no DVT Overnight events: This morning after breakfast she has had a lot of vomiting and coughing.        Vital Signs: Temp:  [98.3 F (36.8 C)-98.6 F (37 C)] 98.3 F (36.8 C) (01/11 0500) Pulse Rate:  [75-101] 98  (01/11 0500) Resp:  [16-22] 18  (01/11 0500) BP: (89-146)/(48-86) 122/86 mmHg (01/11 0500) SpO2:  [95 %-99 %] 96 % (01/11 0803) Weight:  [39.5 kg (87 lb 1.3 oz)] 39.5 kg (87 lb 1.3 oz) (01/11 0500)      I/O last 3 completed shifts: In: 750 [I.V.:750] Out: 500 [Urine:500]  Physical Examination: Gen: frail elderly female breathing comfortably today HEENT: NCAT, PERRL, EOMi, MM dry PULM: Diminished R base, insp crackles L base CV: RRR, no mgr, no JVD AB: soft, nontender, no hsm Ext: warm, no edema, no clubbing, no cyanosis Derm: thin skin with scattered bruises Neuro: A&Ox4, CN II-XII intact, strength 5/5 in all 4 extremities   Labs and Imaging:   Lab 06/05/11 0550 06/04/11 0550 06/03/11 0605  NA 135 131* 133*  K 4.1 4.3 4.9  CL 100 91* 95*  CO2 27 31 33*  BUN 22 23 20   CREATININE 0.55 0.67 0.66  GLUCOSE 76 89 80    Lab 06/05/11 0550 06/04/11 0550 06/03/11 0605  HGB 13.0 13.9 12.6  HCT 40.4 42.5 38.7  WBC 13.5* 13.4* 13.0*  PLT 315 368 315   PCXR: 1/9 changes c/w COPD, bilateral airspace disease.  Small L effusion   Assessment and Plan: Hypoxic respiratory failure in the setting of bilateral pulmonary infiltrates w/ effusions, volume overload, and basilar consolidation.  It is not surprising at all that she is hypoxemic considering the amount of emphysema on CXR in addition to airflow limitation, restriction (kyphosis) and volume overload.  She has been volume up after a-fib with rvr, may have also aspirated given basilar infiltrates and history of aspirating. Significant nausea and vomiting am of 1/9.  If she does not have oropharyngeal aspiration that she has esophageal dysmotility or an obstruction of some sort leading to recurrent aspiration.  Lab 06/05/11 0550 06/04/11 0550 06/03/11 0605  WBC 13.5* 13.4* 13.0*  Pro B Natriuretic peptide (BNP)  Date Value Range Status  06/04/2011 2256.0* 0-450 (pg/mL) Final  Rec/plan:  - Esophogram/Barium swallow with near complete obstruction of the distal esophagus with a large amount of debris and food material within the dilated esophagus. Only a small amount of contrast passes into the stomach. Recommends complete NPO with  GI evaluation for ?PEG.   - QID oral care-not tolerating well.  - Continue antibiotics as written for 10 day course. - Diureses as per primary. - No indication for thoracentesis at this time.    ID - Speech evaluation for aspiration - Continue ceftriaxone and azithro  Dispo:  - DNR - Pending evaluation of swallowing / ? Stricture per GI prior to d/c.  PCCM will be available on a PRN bases at this point, please call back if needed.  Alyson Reedy, M.D. [facility]monary/Critical Care Medicine. Pager: [phone]. After hours pager: [phone].

## 2011-06-05 NOTE — Progress Notes (Signed)
Portable CXR this am  For follow up of esophageal impaction shows slight improvement in the level of the impaction. I have encouraged pt and her family for pt to sit up straight and sip small amounts of water to facilitate clearing of the esophagus. We plan to repeat  CXR in am

## 2011-06-05 NOTE — Progress Notes (Signed)
PT Cancellation Note  Treatment cancelled today due to medical issues with patient which prohibited therapy.  Patient with O2 sats < 88 while sitting in chair.  Patient also reports she "cannot possibly do anything until they get this food out of" her.   Will check back on Monday.  Olivia Canter, [place] [phone] 06/05/2011, 10:43 AM

## 2011-06-05 NOTE — Progress Notes (Signed)
PT Cancellation Note  Treatment cancelled today due to patient's refusal to participate.  Patient reporting she has not eaten and cannot participate in therapy today  Michelle Hopkins, [place] [phone] 06/05/2011, 2:45 PM

## 2011-06-05 NOTE — Progress Notes (Signed)
Patient Name: Michelle Hopkins      SUBJECTIVE: feelling better  Antibiotics adjusted per IM and pukmonary;  Barium swallow >>distal obstruction eval pending; pulmonary process maybe recurrent aspiration;  GI Packed esophagus  W/u ongoing Past Medical History  Diagnosis Date  . COPD (chronic obstructive pulmonary disease)   . HTN (hypertension)   . Hypothyroidism   . Osteoporosis   . Postherpetic neuralgia     PHYSICAL EXAM Filed Vitals:   06/04/11 2312 06/05/11 0114 06/05/11 0500 06/05/11 0803  BP: 106/71  122/86   Pulse:   98   Temp:   98.3 F (36.8 C)   TempSrc:   Oral   Resp:   18   Height:      Weight:   87 lb 1.3 oz (39.5 kg)   SpO2:  96% 97% 96%  BP 122/86  Pulse 98  Temp(Src) 98.3 F (36.8 C) (Oral)  Resp 18  Ht 5\' 2"  (1.575 m)  Wt 87 lb 1.3 oz (39.5 kg)  BMI 15.93 kg/m2  SpO2 96%  Temp (24hrs), Avg:98.4 F (36.9 C), Min:98.3 F (36.8 C), Max:98.6 F (37 C)  Wt down 10 lbs since admission  Well developed and nourished in mild respiratory distress    Intake/Output Summary (Last 24 hours) at 06/05/11 0805 Last data filed at 06/05/11 0500  Gross per 24 hour  Intake    750 ml  Output    300 ml  Net    450 ml  I/O --3L net   LABS: Basic Metabolic Panel:  Lab 06/05/11 0865 06/04/11 0550 06/03/11 0605 06/02/11 0358 06/01/11 0717 05/31/11 0545 05/30/11 0600  NA 135 131* 133* 136 138 141 140  K 4.1 4.3 4.9 4.1 3.7 3.8 4.2  CL 100 91* 95* 94* 96 100 105  CO2 27 31 33* 37* 33* 34* 30  GLUCOSE 76 89 80 94 88 94 96  BUN 22 23 20 22 18 18 19   CREATININE 0.55 0.67 0.66 0.63 0.55 0.55 0.50  CALCIUM 8.0* 8.4 -- -- -- -- --  MG -- -- -- -- -- -- --  PHOS -- -- -- -- -- -- --   Cardiac Enzymes: No results found for this basename: CKTOTAL:3,CKMB:3,CKMBINDEX:3,TROPONINI:3 in the last 72 hours CBC:  Lab 06/05/11 0550 06/04/11 0550 06/03/11 0605 06/02/11 0358 06/01/11 0717 05/31/11 0545 05/30/11 0600  WBC 13.5* 13.4* 13.0* 11.6* 9.3 10.3 8.6  NEUTROABS -- --  -- -- -- -- --  HGB 13.0 13.9 12.6 11.9* 11.9* 11.5* 11.3*  HCT 40.4 42.5 38.7 37.2 37.3 35.7* 35.5*  MCV 86.5 86.0 86.6 86.9 87.1 87.9 89.2  PLT 315 368 315 299 272 265 241   PROTIME:  Basename 06/05/11 0550 06/04/11 0550 06/03/11 0605  LABPROT 28.8* 24.8* 29.7*  INR 2.66* 2.20* 2.77*   Liver Function Tests:  Basename 06/05/11 0550 06/04/11 0550  AST 29 47*  ALT 38* 48*  ALKPHOS 122* 141*  BILITOT 0.3 0.3  PROT 5.4* 5.9*  ALBUMIN 2.5* 2.7*      ASSESSMENT AND PLAN:  Stable from rhtyhm point of view  Patient Active Hospital Problem List: Pneumonia (05/27/2011)  Esophageal obstruction (06/04/2011)   Atrial fibrillation (05/27/2011)  Acute on chronic diastolic heart failure (06/01/2011)  Elevated transaminase level (05/29/2011)        She continues to have atrial fibrillation in the setting of her pneumonia with a rapid but improved ventricular response i think there is a component of HFPEF ; we gave her one more dose  of diuretics but I suspect the increasing hear rates are a sign now of intravascular depletion so will stop diuretics AF HR now abuout 100  Given her NPO, i have taken the liberty of increasing saline for now until teh feeding issue is resolved.. I suspect as above that she is relatively dehydrated esp with inability to take PO the last 24 hrs  From AF perspective she is on coumadin   (per pharmacy so NOT on MAR)      Signed, Sherryl Manges MD  06/05/2011    Signed, Sherryl Manges MD  06/05/2011     Signed, Sherryl Manges MD  06/05/2011

## 2011-06-05 NOTE — Progress Notes (Signed)
OT Cancellation Note  Treatment cancelled today due to pt stating thast she does not feel well, cannot eat any food and that she needs to save her energy. Pt's family present this afternoon and stated that pt had not been able to eat any food due to esophageal issues since Monday.  Margaretmary Eddy [facility] 06/05/2011, 1:06 PM

## 2011-06-05 NOTE — Progress Notes (Signed)
ANTICOAGULATION CONSULT NOTE - Follow Up Consult  Pharmacy Consult:  Lovenox Indication: atrial fibrillation  Allergies  Allergen Reactions  . Avelox (Moxifloxacin Hcl In Nacl)   . Canarium     Unknown reaction  . Codeine   . Latex     Rash   . Neosporin (Neomycin-Polymyxin B)   . Rice   . Tape     Rips skin    Patient Measurements: Height: 5\' 2"  (157.5 cm) Weight: 87 lb 1.3 oz (39.5 kg) IBW/kg (Calculated) : 50.1   Vital Signs: Temp: 98.3 F (36.8 C) (01/11 0500) Temp src: Oral (01/11 0500) BP: 122/86 mmHg (01/11 0500) Pulse Rate: 98  (01/11 0500)  Labs:  Basename 06/05/11 0550 06/04/11 0550 06/03/11 0605  HGB 13.0 13.9 --  HCT 40.4 42.5 38.7  PLT 315 368 315  APTT -- -- --  LABPROT 28.8* 24.8* 29.7*  INR 2.66* 2.20* 2.77*  HEPARINUNFRC -- -- --  CREATININE 0.55 0.67 0.66  CKTOTAL -- -- --  CKMB -- -- --  TROPONINI -- -- --   Estimated Creatinine Clearance: 33.8 ml/min (by C-G formula based on Cr of 0.55).   Medications:  Scheduled:     . antiseptic oral rinse  15 mL Mouth Rinse BID  . azithromycin  500 mg Intravenous Q24H  . cefTRIAXone (ROCEPHIN)  IV  1 g Intravenous Q24H  . diltiazem  10 mg Intravenous Once  . Fluticasone-Salmeterol  1 puff Inhalation BID  . levalbuterol  0.63 mg Nebulization Q6H  . levothyroxine  44 mcg Intravenous QAC breakfast  . mirtazapine  15 mg Oral QHS  . pantoprazole (PROTONIX) IV  40 mg Intravenous Daily  . tiotropium  18 mcg Inhalation Daily  . DISCONTD: feeding supplement  237 mL Oral BID BM  . DISCONTD: gabapentin  300 mg Oral TID  . DISCONTD: levothyroxine  44 mcg Intravenous QAC breakfast  . DISCONTD: levothyroxine  88 mcg Oral QAC breakfast  . DISCONTD: NIFEdipine  10 mg Sublingual BID  . DISCONTD: omeprazole  20 mg Oral Q breakfast  . DISCONTD: warfarin  3 mg Oral ONCE-1800    Assessment: 85 YOF with new Afib to start Lovenox d/t inability to take PO meds. INR slightly supratherapeutic today, CBC stable,  no bleeding noted.  Infectious Disease: Azith/CTX D#5 (abx D#9) for PNA:  afebrile,WBC unchanged (13.5).  No cx. Cardiovascular: still in AFib and to start diltiazem gtt, BP at goal, HR trending down, Echo: EF=55-60% Endocrinology: no DM, glucose controlled, on home Synthroid ( IV) - TSH ok Gastrointestinal / Nutrition: wt loss, on clear liquid diet, poor PO intake d/t esophageal impaction/partial obstruction, may have EGD and biopsy if necessary.  Plan pending CXR.  Transaminases trending down -- not on a statin. Neurology: Neurontin for herpetic neuralgia, home Remeron at bedtime, volume depletion d/t poor PO intake Nephrology: renal fxn stable, CrCL 34 ml/min Pulmonary: COPD: still not breathing well, 96% on 5L; Advair, Spiriva, Xopenex Hematology / Oncology - CBC ok - No malignancy on CT. Best Practices: INR supratherapeutic, PPI  Goal of Therapy:  Coumadin:  INR = 2-2.5 per MD Lovenox:  Appropriate dosing  Plan:  - F/U INR and start Lovenox once INR < 2 - F/U plan re: anticoagulation (ASA vs Coumadin) - F/U abx LOT   Phillips Climes, PharmD 06/05/2011,8:38 AM

## 2011-06-05 NOTE — Progress Notes (Signed)
Subjective: Doing ok, mult family members in the room today.  Was seen by Cards and GI at the same time and plan of action discussed with them directly.  Objective: Vital signs in last 24 hours: Temp:  [98.3 F (36.8 C)-98.6 F (37 C)] 98.3 F (36.8 C) (01/11 0500) Pulse Rate:  [75-101] 98  (01/11 0500) Resp:  [16-22] 18  (01/11 0500) BP: (89-146)/(48-86) 122/86 mmHg (01/11 0500) SpO2:  [95 %-99 %] 96 % (01/11 0803) Weight:  [39.5 kg (87 lb 1.3 oz)] 39.5 kg (87 lb 1.3 oz) (01/11 0500) Weight change:  Last BM Date: 06/03/11  Intake/Output from previous day: 01/10 0701 - 01/11 0700 In: 750 [I.V.:750] Out: 300 [Urine:300] Intake/Output this shift:   General appearance: alert, cooperative, appears stated age and no distress  Resp: better aeration, no adventitia  Chest wall: no tenderness  Cardio: irregularly irregular rhythm  GI: soft, non-tender; bowel sounds normal; no masses, no organomegaly  Extremities: extremities normal, atraumatic, no cyanosis or edema    Lab Results:  [facility] 06/05/11 0550 06/04/11 0550  WBC 13.5* 13.4*  HGB 13.0 13.9  HCT 40.4 42.5  PLT 315 368   BMET  Basename 06/05/11 0550 06/04/11 0550  NA 135 131*  K 4.1 4.3  CL 100 91*  CO2 27 31  GLUCOSE 76 89  BUN 22 23  CREATININE 0.55 0.67  CALCIUM 8.0* 8.4    Studies/Results: Dg Esophagus  06/03/2011  *RADIOLOGY REPORT*  Clinical Data: Regurgitation, vomiting.  Question stricture.  ESOPHOGRAM/BARIUM SWALLOW  Technique:  Single contrast examination was performed using thin barium.  Fluoroscopy time:  1.46 minutes.  Comparison:  CT 05/27/2011  Findings:  The esophagus is nearly completely obstructed distally. Only a small amount of contrast material passes beyond a distal obstruction and into the stomach.  Extensive food material is seen within the dilated esophagus.  Large filling defects noted distally in the esophagus.  It is unknown whether this represents a mass or possibly impacted food at a  severe distal esophageal stricture.  IMPRESSION: Near complete obstruction of the distal esophagus with a large amount of debris and food material within the dilated esophagus. Only a small amount of contrast passes into the stomach.  Recommend endoscopy for further evaluation.  These results were called to Dr. Wylene Simmer at the time of interpretation.  Original Report Authenticated By: Cyndie Chime, M.D.   Dg Chest Port 1 View  06/05/2011  *RADIOLOGY REPORT*  Clinical Data: Shortness of breath  PORTABLE CHEST - 1 VIEW  Comparison: 06/03/2011  Findings: Hyperexpansion is consistent with emphysema. The cardiopericardial silhouette is enlarged.  There is bibasilar atelectasis or infiltrate with small bilateral pleural effusions. Contrast and debris is seen in the mid and distal esophagus consistent with esophageal obstruction diagnosed on esophagram 2 days ago. Telemetry leads overlie the chest.  IMPRESSION: Emphysema with bibasilar atelectasis/infiltrate and small bilateral pleural effusions.  Original Report Authenticated By: ERIC A. MANSELL, M.D.   Dg Chest Port 1 View  06/03/2011  *RADIOLOGY REPORT*  Clinical Data: Nausea, vomiting, low oxygen saturation  PORTABLE CHEST - 1 VIEW  Comparison: Portable exam 1057 hours compared to 06/02/2011  Findings: Upper-normal size of cardiac silhouette. Prominent central pulmonary arteries stable. Mediastinal contours normal. Severe emphysematous changes. Chronic bibasilar opacities left greater than right. Minimal perihilar infiltrates/edema. Small left pleural effusion. No pneumothorax. Diffuse osseous demineralization.  IMPRESSION: COPD with suspect mild CHF. Bibasilar opacities, particularly on left, either representing consolidation versus atelectasis left lower lobe. Small left  pleural effusion.  Original Report Authenticated By: Lollie Marrow, M.D.    Medications:  I have reviewed the patient's current medications. Scheduled:   . antiseptic oral rinse  15 mL  Mouth Rinse BID  . azithromycin  500 mg Intravenous Q24H  . cefTRIAXone (ROCEPHIN)  IV  1 g Intravenous Q24H  . diltiazem  10 mg Intravenous Once  . Fluticasone-Salmeterol  1 puff Inhalation BID  . levalbuterol  0.63 mg Nebulization Q6H  . levothyroxine  44 mcg Intravenous QAC breakfast  . mirtazapine  15 mg Oral QHS  . pantoprazole (PROTONIX) IV  40 mg Intravenous Daily  . tiotropium  18 mcg Inhalation Daily  . DISCONTD: feeding supplement  237 mL Oral BID BM  . DISCONTD: gabapentin  300 mg Oral TID  . DISCONTD: levothyroxine  44 mcg Intravenous QAC breakfast  . DISCONTD: levothyroxine  88 mcg Oral QAC breakfast  . DISCONTD: NIFEdipine  10 mg Sublingual BID  . DISCONTD: omeprazole  20 mg Oral Q breakfast  . DISCONTD: warfarin  3 mg Oral ONCE-1800   Continuous:   . sodium chloride 75 mL/hr at 06/05/11 0536  . diltiazem (CARDIZEM) infusion     ZOX:[id number], morphine injection, ondansetron (ZOFRAN) IV, DISCONTD: LORazepam, DISCONTD: LORazepam  Assessment/Plan: 1) Multilobar Pneumonia. Has completed a 7 day course of abx from her admission on 1/10 and was d/ced yesterday, but restarted. Not sure if there is a different point of 7 days that is being used by Pulm. WIll continue for now.  2) Afib. Rate controlled on Cardizem, will convert to IV as >? Po absorption  WIll convert to Lovenox once INR <2 as cannot do oral Coumadin. I know it is available IV, but given these comorbidities, I wonder if anticoagulation vs ASA only once esophagus open would be a better option for safety reasons.  Cards to help with decision making. 3) Osteoporosis: As above.  4) Weight loss.Imaging shows an esophagus filled with food.  To get another x ray today and if 1/2 down, GI to consider EGD.  May try SL NTG as well.  Procardia SL made her too dizzy.  Considering radiology to try to place NG under guidance as alternateive.  GI to make decision post x ray. Marland Kitchen5) HTN Controlled, Cardizem as well.  6) WBC  Normalized  7) Herpetic neuralgia:Neurontn held due to limited PO  Morphine PRN 8) PT/OT incentive spirometer. Clear liquid sips to help, keep upright today. Family is aware that my partner, Dr. Timothy Lasso will be covering the weekend.   LOS: 9 days   Taija Mathias W 06/05/2011, 8:21 AM

## 2011-06-06 ENCOUNTER — Inpatient Hospital Stay (HOSPITAL_COMMUNITY): Payer: Medicare Other

## 2011-06-06 DIAGNOSIS — I4891 Unspecified atrial fibrillation: Secondary | ICD-10-CM

## 2011-06-06 DIAGNOSIS — I5023 Acute on chronic systolic (congestive) heart failure: Secondary | ICD-10-CM

## 2011-06-06 LAB — CBC
HCT: 41.5 % (ref 36.0–46.0)
Hemoglobin: 13.3 g/dL (ref 12.0–15.0)
MCV: 86.8 fL (ref 78.0–100.0)
RBC: 4.78 MIL/uL (ref 3.87–5.11)
WBC: 13.9 10*3/uL — ABNORMAL HIGH (ref 4.0–10.5)

## 2011-06-06 LAB — COMPREHENSIVE METABOLIC PANEL
BUN: 17 mg/dL (ref 6–23)
CO2: 22 mEq/L (ref 19–32)
Chloride: 101 mEq/L (ref 96–112)
Creatinine, Ser: 0.49 mg/dL — ABNORMAL LOW (ref 0.50–1.10)
GFR calc non Af Amer: 88 mL/min — ABNORMAL LOW (ref >90)
Glucose, Bld: 64 mg/dL — ABNORMAL LOW (ref 70–99)
Total Bilirubin: 0.2 mg/dL — ABNORMAL LOW (ref 0.3–1.2)

## 2011-06-06 LAB — PROTIME-INR
INR: 3.22 — ABNORMAL HIGH (ref 0.00–1.49)
Prothrombin Time: 33.4 seconds — ABNORMAL HIGH (ref 11.6–15.2)

## 2011-06-06 MED ORDER — IPRATROPIUM BROMIDE 0.02 % IN SOLN
0.5000 mg | Freq: Once | RESPIRATORY_TRACT | Status: AC
  Administered 2011-06-06: 0.5 mg via RESPIRATORY_TRACT
  Filled 2011-06-06: qty 2.5

## 2011-06-06 MED ORDER — DEXTROSE 50 % IV SOLN
50.0000 mL | INTRAVENOUS | Status: AC
  Administered 2011-06-06: 50 mL via INTRAVENOUS

## 2011-06-06 MED ORDER — ALBUTEROL SULFATE (5 MG/ML) 0.5% IN NEBU
2.5000 mg | INHALATION_SOLUTION | Freq: Once | RESPIRATORY_TRACT | Status: AC
  Administered 2011-06-06: 2.5 mg via RESPIRATORY_TRACT
  Filled 2011-06-06: qty 0.5

## 2011-06-06 MED ORDER — FUROSEMIDE 10 MG/ML IJ SOLN
40.0000 mg | Freq: Once | INTRAMUSCULAR | Status: AC
  Administered 2011-06-06: 40 mg via INTRAVENOUS
  Filled 2011-06-06: qty 4

## 2011-06-06 MED ORDER — DEXTROSE 50 % IV SOLN
INTRAVENOUS | Status: AC
  Administered 2011-06-06: 50 mL via INTRAVENOUS
  Filled 2011-06-06: qty 50

## 2011-06-06 MED ORDER — DEXTROSE-NACL 5-0.9 % IV SOLN
INTRAVENOUS | Status: DC
  Administered 2011-06-06 – 2011-06-10 (×6): via INTRAVENOUS

## 2011-06-06 NOTE — Significant Event (Signed)
Responded to Pt's room due to decreasd Spo2. On arrival, Pt's o2sats appeared to be decreased reading in the 70's with a poor pleth diagram but variably reaching to as high as 92%. A second Spo2 unit was used to confirm the sat but unattainable i believe due to diminishing or poor collateral circulation. Several attempts were made to attempt to obtain a believable o2sat with no success. Pt was repositioned in bed and elevated, placed [place] with a venturi mask set at 50% fio2, duoneb given, flutter valve initated but pt had great effort and poor coordination. Pt left to rest and appeared comfortable with good color.

## 2011-06-06 NOTE — Consult Note (Signed)
Subjective:  Patient is still SOB Objective: Filed Vitals:   06/05/11 1500 06/05/11 2200 06/06/11 0524 06/06/11 0530  BP: 99/62 110/81  103/66  Pulse: 82 87 84 80  Temp: 98 F (36.7 C) 98.8 F (37.1 C)  97.4 F (36.3 C)  TempSrc: Oral     Resp: 20 21 20 18   Height:      Weight:      SpO2: 96% 94%  90%   Weight change:   Intake/Output Summary (Last 24 hours) at 06/06/11 1147 Last data filed at 06/05/11 1800  Gross per 24 hour  Intake   1150 ml  Output    350 ml  Net    800 ml    General: Alert, awake, oriented x3, in no acute distress Neck:  JVP is minimally increased. Heart: irregular rate and rhythm, without murmurs, rubs, gallops.  Lungs: Crackles at R base Exemities:  No edema.   Neuro: Grossly intact, nonfocal.   Lab Results: Results for orders placed during the hospital encounter of 05/27/11 (from the past 24 hour(s))  CBC     Status: Abnormal   Collection Time   06/06/11  5:30 AM      Component Value Range   WBC 13.9 (*) 4.0 - 10.5 (K/uL)   RBC 4.78  3.87 - 5.11 (MIL/uL)   Hemoglobin 13.3  12.0 - 15.0 (g/dL)   HCT 81.1  91.4 - 78.2 (%)   MCV 86.8  78.0 - 100.0 (fL)   MCH 27.8  26.0 - 34.0 (pg)   MCHC 32.0  30.0 - 36.0 (g/dL)   RDW 95.6  21.3 - 08.6 (%)   Platelets 331  150 - 400 (K/uL)  COMPREHENSIVE METABOLIC PANEL     Status: Abnormal   Collection Time   06/06/11  5:30 AM      Component Value Range   Sodium 134 (*) 135 - 145 (mEq/L)   Potassium 3.8  3.5 - 5.1 (mEq/L)   Chloride 101  96 - 112 (mEq/L)   CO2 22  19 - 32 (mEq/L)   Glucose, Bld 64 (*) 70 - 99 (mg/dL)   BUN 17  6 - 23 (mg/dL)   Creatinine, Ser 5.78 (*) 0.50 - 1.10 (mg/dL)   Calcium 7.9 (*) 8.4 - 10.5 (mg/dL)   Total Protein 5.4 (*) 6.0 - 8.3 (g/dL)   Albumin 2.5 (*) 3.5 - 5.2 (g/dL)   AST 22  0 - 37 (U/L)   ALT 31  0 - 35 (U/L)   Alkaline Phosphatase 114  39 - 117 (U/L)   Total Bilirubin 0.2 (*) 0.3 - 1.2 (mg/dL)   GFR calc non Af Amer 88 (*) >90 (mL/min)   GFR calc Af Amer >90   >90 (mL/min)  PROTIME-INR     Status: Abnormal   Collection Time   06/06/11  5:30 AM      Component Value Range   Prothrombin Time 33.4 (*) 11.6 - 15.2 (seconds)   INR 3.22 (*) 0.00 - 1.49   GLUCOSE, CAPILLARY     Status: Abnormal   Collection Time   06/06/11  6:45 AM      Component Value Range   Glucose-Capillary 55 (*) 70 - 99 (mg/dL)    Studies/Results: Dg Chest Port 1 View  06/06/2011  *RADIOLOGY REPORT*  Clinical Data: Decreased O2 saturation; history of smoking.  PORTABLE CHEST - 1 VIEW  Comparison: Chest radiograph performed 06/05/2011  Findings: There is a persistent small left pleural effusion. Worsening bibasilar airspace  opacification is noted, raising concern for pneumonia.  Edema could have a similar appearance, though persistent biapical sparing suggests against edema.  No pneumothorax is identified.  The cardiomediastinal silhouette is borderline normal in size.  No acute osseous abnormalities are seen.  IMPRESSION: Worsening bibasilar airspace opacification, with persistent small left pleural effusion.  Findings raise concern for pneumonia. Edema could have a similar appearance, but is considered less likely.  Original Report Authenticated By: Tonia Ghent, M.D.   Dg Chest Port 1 View  06/05/2011  *RADIOLOGY REPORT*  Clinical Data: Shortness of breath  PORTABLE CHEST - 1 VIEW  Comparison: 06/03/2011  Findings: Hyperexpansion is consistent with emphysema. The cardiopericardial silhouette is enlarged.  There is bibasilar atelectasis or infiltrate with small bilateral pleural effusions. Contrast and debris is seen in the mid and distal esophagus consistent with esophageal obstruction diagnosed on esophagram 2 days ago. Telemetry leads overlie the chest.  IMPRESSION: Emphysema with bibasilar atelectasis/infiltrate and small bilateral pleural effusions.  Original Report Authenticated By: ERIC A. MANSELL, M.D.    Medications: I have reviewed the patient's current  medications.   Patient Active Hospital Problem List: Pneumonia (05/27/2011)   Assessment: Per primary team.  ON ABX   Plan:   Atrial fibrillation (05/27/2011)   Assessment: RAtes are better.  Keep on current regimen  Coumadin on hold   Plan: Acute on chronic diastolic heart failure (06/01/2011)   Assessment: I would recomm 1 dose of IV lasix.  Strict I/O  BNP in AM with BMET   Plan: Esophageal obstruction (06/04/2011)   Assessment: Per GI   Plan:  LOS: 10 days   Dietrich Pates 06/06/2011, 11:47 AM

## 2011-06-06 NOTE — Progress Notes (Signed)
Patient's O2 saturation dropping to 85% on 5L O2 per [place].  Increased to 7L and paged MD.  Orders received for nebs, portable CXR and flutter administration PRN.  Applied continuous SPO2 monitor to telemetry box for closer monitoring.

## 2011-06-06 NOTE — Progress Notes (Addendum)
Subjective: More Hypoxic and more issues overnight.  Increased FIO2, suctioned and Neb given. She is sharp. She denies pain or suffering. She wants food/coffee but understands why we are not giving it.  Surrounded by family. SOB/Tachypneia - weraing 50% venturi mask   Objective: Vital signs in last 24 hours: Temp:  [97.4 F (36.3 C)-98.8 F (37.1 C)] 97.4 F (36.3 C) (01/12 0530) Pulse Rate:  [80-87] 80  (01/12 0530) Resp:  [18-21] 18  (01/12 0530) BP: (99-110)/(62-81) 103/66 mmHg (01/12 0530) SpO2:  [90 %-96 %] 90 % (01/12 0530) Weight change:  Last BM Date: 06/03/11  Intake/Output from previous day: 01/11 0701 - 01/12 0700 In: 1150 [P.O.:60; I.V.:1090] Out: 350 [Urine:350] Intake/Output this shift:   General appearance: alert, cooperative, Resp: distant - rhonhi Chest wall: no tenderness  Cardio: irregularly irregular rhythm  GI: soft, non-tender; bowel sounds normal; no masses, no organomegaly  Extremities: extremities normal, atraumatic, no cyanosis or edema    Lab Results:  Basename 06/06/11 0530 06/05/11 0550  WBC 13.9* 13.5*  HGB 13.3 13.0  HCT 41.5 40.4  PLT 331 315   BMET  Basename 06/06/11 0530 06/05/11 0550  NA 134* 135  K 3.8 4.1  CL 101 100  CO2 22 27  GLUCOSE 64* 76  BUN 17 22  CREATININE 0.49* 0.55  CALCIUM 7.9* 8.0*    Studies/Results: Dg Chest Port 1 View  06/06/2011  *RADIOLOGY REPORT*  Clinical Data: Decreased O2 saturation; history of smoking.  PORTABLE CHEST - 1 VIEW  Comparison: Chest radiograph performed 06/05/2011  Findings: There is a persistent small left pleural effusion. Worsening bibasilar airspace opacification is noted, raising concern for pneumonia.  Edema could have a similar appearance, though persistent biapical sparing suggests against edema.  No pneumothorax is identified.  The cardiomediastinal silhouette is borderline normal in size.  No acute osseous abnormalities are seen.  IMPRESSION: Worsening bibasilar airspace  opacification, with persistent small left pleural effusion.  Findings raise concern for pneumonia. Edema could have a similar appearance, but is considered less likely.  Original Report Authenticated By: Tonia Ghent, Hopkins.D.   Dg Chest Port 1 View  06/05/2011  *RADIOLOGY REPORT*  Clinical Data: Shortness of breath  PORTABLE CHEST - 1 VIEW  Comparison: 06/03/2011  Findings: Hyperexpansion is consistent with emphysema. The cardiopericardial silhouette is enlarged.  There is bibasilar atelectasis or infiltrate with small bilateral pleural effusions. Contrast and debris is seen in the mid and distal esophagus consistent with esophageal obstruction diagnosed on esophagram 2 days ago. Telemetry leads overlie the chest.  IMPRESSION: Emphysema with bibasilar atelectasis/infiltrate and small bilateral pleural effusions.  Original Report Authenticated By: ERIC A. MANSELL, Hopkins.D.    Medications:  I have reviewed the patient's current medications. Scheduled:    . ipratropium  0.5 mg Nebulization Once   And  . albuterol  2.5 mg Nebulization Once  . antiseptic oral rinse  15 mL Mouth Rinse BID  . azithromycin  500 mg Intravenous Q24H  . cefTRIAXone (ROCEPHIN)  IV  1 g Intravenous Q24H  . dextrose  50 mL Intravenous STAT  . Fluticasone-Salmeterol  1 puff Inhalation BID  . levalbuterol  0.63 mg Nebulization QID  . levothyroxine  44 mcg Intravenous QAC breakfast  . lidocaine  1 patch Transdermal Q24H  . mirtazapine  15 mg Oral QHS  . pantoprazole (PROTONIX) IV  40 mg Intravenous Daily  . tiotropium  18 mcg Inhalation Daily  . DISCONTD: levalbuterol  0.63 mg Nebulization Q6H   Continuous:    .  dextrose 5 % and 0.9% NaCl 75 mL/hr at 06/06/11 0700  . diltiazem (CARDIZEM) infusion 5 mg/hr (06/05/11 2207)  . DISCONTD: sodium chloride 75 mL/hr at 06/05/11 0800   ZOX:[id number], morphine injection, ondansetron (ZOFRAN) IV  Assessment/Plan: 1) Worsening Multilobar Pneumonia/Asp PNA. Has completed a 7 day  course of abx from her admission on 1/10 and was d/ced 2 days ago and then restarted. WIll continue for now.  2) Worsening Hypoxemia - work on Oxygenation.  She is comfortable despite her setbacks.  If she starts to suffer or worsens further we may need to progress towards a hospice palliative care approach. 3) Afib. Rate controlled on Cardizem.  WIll convert to Lovenox once INR <2 as cannot do oral Coumadin. I know it is available IV, but given these comorbidities, I wonder if anticoagulation vs ASA only once esophagus open would be a better option for safety reasons.  Cards to help with decision making. 4) Osteoporosis: As above.  5) Dysphagia/Weight loss.Imaging shows an esophagus filled with food. GI to consider EGD if possible.  Procardia SL made her too dizzy.  Considering radiology to try to place NG under guidance as alternateive.  GI to make decision soon. 6) HTN Controlled, Cardizem as well.  7) WBC stable at 13.5-13.9 8) Herpetic neuralgia:Neurontin held due to limited PO  Morphine PRN 9) PT/OT incentive spirometer. Clear liquid sips to help, keep upright today.   LOS: 10 days   Michelle Hopkins 06/06/2011, 10:52 AM   She was also Hypoglycemic this am and had D50 and D5 NS started.

## 2011-06-06 NOTE — Progress Notes (Signed)
ANTICOAGULATION CONSULT NOTE - Follow Up Consult  Pharmacy Consult for Coumadin/Lovnox Indication: atrial fibrillation  Allergies  Allergen Reactions  . Avelox (Moxifloxacin Hcl In Nacl)   . Canarium     Unknown reaction  . Codeine   . Latex     Rash   . Neosporin (Neomycin-Polymyxin B)   . Rice   . Tape     Rips skin    Patient Measurements: Height: 5\' 2"  (157.5 cm) Weight: 87 lb 1.3 oz (39.5 kg) IBW/kg (Calculated) : 50.1   Vital Signs: Temp: 97.4 F (36.3 C) (01/12 0530) BP: 103/66 mmHg (01/12 0530) Pulse Rate: 80  (01/12 0530)  Labs:  Basename 06/06/11 0530 06/05/11 0550 06/04/11 0550  HGB 13.3 13.0 --  HCT 41.5 40.4 42.5  PLT 331 315 368  APTT -- -- --  LABPROT 33.4* 28.8* 24.8*  INR 3.22* 2.66* 2.20*  HEPARINUNFRC -- -- --  CREATININE 0.49* 0.55 0.67  CKTOTAL -- -- --  CKMB -- -- --  TROPONINI -- -- --   Estimated Creatinine Clearance: 33.8 ml/min (by C-G formula based on Cr of 0.49).   Assessment: 55 YOF with new Afib to start Lovenox d/t inability to take PO meds. INR supratherapeutic today, trending up although no coumadin was given in the past 3 days, CBC stable, no bleeding noted  Goal of Therapy:  Start Lovenox when INR < 2   Plan:  - Continue hold coumadin today, - F/U INR and start Lovenox once INR < 2  - F/U plan re: anticoagulation (ASA vs Coumadin)    Riki Rusk 06/06/2011,10:48 AM

## 2011-06-06 NOTE — Progress Notes (Signed)
At 04:00, patient was attempting to get OOB unassisted.  She was remarkably SOB, exhibiting respiratory accessory muscle use and gasp breathing.  Cyanosis noted on nailbeds and lips.  O2 sat was 85-87% on 5L O2 per [place].  Assisted patient back to bed and assisted to bed pan; performed oral suctioning and continued to instruct patient in pulmonary toilet.  Patient produced thick, frothy, yellow secretions x 2, approximately 20ml each.  O2 sat increased to 92% on 5L O2 per [place].  Obtained spot-check pulse-oximeter probe and applied to telemetry box for monitoring.  Continuing to monitor closely. 

## 2011-06-06 NOTE — Progress Notes (Signed)
Michelle Hopkins Daily Rounding Note 06/06/2011, 9:33 AM  SUBJECTIVE: Note hypoxic episodes documented by the nurse and resp therapist.  Now on Venturi mask at 50% O2.    OBJECTIVE: General: Frail, thin. Pleasant and engaged but looks ill and fatigued.  Vital signs in last 24 hours: Temp:  [97.4 F (36.3 C)-98.8 F (37.1 C)] 97.4 F (36.3 C) (01/12 0530) Pulse Rate:  [80-87] 80  (01/12 0530) Resp:  [18-21] 18  (01/12 0530) BP: (99-110)/(62-81) 103/66 mmHg (01/12 0530) SpO2:  [90 %-96 %] 90 % (01/12 0530) Last BM Date: 06/03/11  Heart: Irregular. Chest: Diminished BS especially at right base.  Increased work of breathing/dyspnea.  Using accessory muscles to breath. Abdomen: Soft, thin, NT  Extremities: no edema. Neuro/Psych:  Pleasant, not confused. Not agitated.  Intake/Output from previous day: 01/11 0701 - 01/12 0700 In: 1150 [P.O.:60; I.V.:1090] Out: 350 [Urine:350]  Intake/Output this shift:    Lab Results:  Basename 06/06/11 0530 06/05/11 0550 06/04/11 0550  WBC 13.9* 13.5* 13.4*  HGB 13.3 13.0 13.9  HCT 41.5 40.4 42.5  PLT 331 315 368   BMET  Basename 06/06/11 0530 06/05/11 0550 06/04/11 0550  NA 134* 135 131*  K 3.8 4.1 4.3  CL 101 100 91*  CO2 22 27 31   GLUCOSE 64* 76 89  BUN 17 22 23   CREATININE 0.49* 0.55 0.67  CALCIUM 7.9* 8.0* 8.4   LFT  Basename 06/06/11 0530  PROT 5.4*  ALBUMIN 2.5*  AST 22  ALT 31  ALKPHOS 114  BILITOT 0.2*  BILIDIR --  IBILI --   PT/INR  Basename 06/06/11 0530 06/05/11 0550  LABPROT 33.4* 28.8*  INR 3.22* 2.66*   Hepatitis Panel No results found for this basename: HEPBSAG,HCVAB,HEPAIGM,HEPBIGM in the last 72 hours  Studies/Results: Dg Chest Port 1 View  06/06/2011  *RADIOLOGY REPORT*  Clinical Data: Decreased O2 saturation; history of smoking.  PORTABLE CHEST - 1 VIEW  Comparison: Chest radiograph performed 06/05/2011  Findings: There is a persistent small left pleural effusion. Worsening bibasilar airspace  opacification is noted, raising concern for pneumonia.  Edema could have a similar appearance, though persistent biapical sparing suggests against edema.  No pneumothorax is identified.  The cardiomediastinal silhouette is borderline normal in size.  No acute osseous abnormalities are seen.  IMPRESSION: Worsening bibasilar airspace opacification, with persistent small left pleural effusion.  Findings raise concern for pneumonia. Edema could have a similar appearance, but is considered less likely.  Original Report Authenticated By: Tonia Ghent, M.D.   ASSESMENT: 1.  Severe dysphagia with retained esophageal contents. She has been without significant nutrition for several days, and poor intake PTA.    2.  Pneumonia.  Strong liklihood this is a result of aspiration.  On rocephin and zithromax.  3.  A Fib, RVR.  Coumadin started this admit, now dc'd.  4.  Diastolic heart failure.  PLAN: 1.  No more clear liquids, hard candy ok. 2.  Pt may need a peg but ? How will we place it if she continues to accumulate food in esophagus.  At present would need intubation for airway protection if we do egd or place a peg.    3.  No swallow study yet.   4.  Stop daily cbc, cmet and coags.  Order these as needed.  5.  CXR 2 view, non portable, in AM   LOS: 10 days   Jennye Moccasin  06/06/2011, 9:33 AM Pager: [phone]  I have reviewed the  above note, examined the patient and agree with plan of treatment. I have discussed extensively our options here. Her respiratory condition has deteriorated since yesterday, either due to aspiration or due to worsening CHF. Dr Tenny Craw will give additional Lasix. . If she improves we may consider EGD providing tomorrow CXR shows emptying of the food from the esophagus, we will get a PA and lat tomorrow. I have discussed PEG placement with family, nobody is ready to make that decision  And she may not be a candidate for PEG if her respiratory condition does not improve.Will reassess  tomorrow.

## 2011-06-06 NOTE — Progress Notes (Signed)
Changed to venti mask as per md

## 2011-06-07 ENCOUNTER — Inpatient Hospital Stay (HOSPITAL_COMMUNITY): Payer: Medicare Other

## 2011-06-07 LAB — BASIC METABOLIC PANEL
BUN: 9 mg/dL (ref 6–23)
CO2: 24 mEq/L (ref 19–32)
Calcium: 7.8 mg/dL — ABNORMAL LOW (ref 8.4–10.5)
Chloride: 100 mEq/L (ref 96–112)
GFR calc non Af Amer: 90 mL/min — ABNORMAL LOW (ref >90)
GFR calc non Af Amer: >90 mL/min (ref >90)
Glucose, Bld: 102 mg/dL — ABNORMAL HIGH (ref 70–99)
Glucose, Bld: 92 mg/dL (ref 70–99)
Potassium: 3.1 mEq/L — ABNORMAL LOW (ref 3.5–5.1)
Potassium: 4.2 mEq/L (ref 3.5–5.1)
Sodium: 133 mEq/L — ABNORMAL LOW (ref 135–145)
Sodium: 132 mEq/L — ABNORMAL LOW (ref 135–145)

## 2011-06-07 LAB — PRO B NATRIURETIC PEPTIDE: Pro B Natriuretic peptide (BNP): 924.6 pg/mL — ABNORMAL HIGH (ref 0–450)

## 2011-06-07 MED ORDER — POTASSIUM CHLORIDE 10 MEQ/100ML IV SOLN
10.0000 meq | INTRAVENOUS | Status: AC
  Administered 2011-06-07 (×4): 10 meq via INTRAVENOUS
  Filled 2011-06-07 (×4): qty 100

## 2011-06-07 MED ORDER — FUROSEMIDE 10 MG/ML IJ SOLN
40.0000 mg | Freq: Once | INTRAMUSCULAR | Status: AC
  Administered 2011-06-07: 40 mg via INTRAVENOUS
  Filled 2011-06-07: qty 4

## 2011-06-07 NOTE — Progress Notes (Signed)
ANTICOAGULATION CONSULT NOTE - Follow Up Consult  Pharmacy Consult for Coumadin/Lovnox  Indication: atrial fibrillation   Allergies  Allergen Reactions  . Avelox (Moxifloxacin Hcl In Nacl)   . Canarium     Unknown reaction  . Codeine   . Latex     Rash   . Neosporin (Neomycin-Polymyxin B)   . Rice   . Tape     Rips skin    Patient Measurements: Height: 5\' 2"  (157.5 cm) Weight: 87 lb 1.3 oz (39.5 kg) IBW/kg (Calculated) : 50.1   Vital Signs: Temp: 98.2 F (36.8 C) (01/13 0600) BP: 95/62 mmHg (01/13 0600) Pulse Rate: 84  (01/13 0600)  Labs:  Basename 06/07/11 0530 06/06/11 0530 06/05/11 0550  HGB -- 13.3 13.0  HCT -- 41.5 40.4  PLT -- 331 315  APTT -- -- --  LABPROT 37.4* 33.4* 28.8*  INR 3.72* 3.22* 2.66*  HEPARINUNFRC -- -- --  CREATININE 0.47* 0.49* 0.55  CKTOTAL -- -- --  CKMB -- -- --  TROPONINI -- -- --   Estimated Creatinine Clearance: 33.8 ml/min (by C-G formula based on Cr of 0.47).   Medications:  Scheduled:    . antiseptic oral rinse  15 mL Mouth Rinse BID  . azithromycin  500 mg Intravenous Q24H  . cefTRIAXone (ROCEPHIN)  IV  1 g Intravenous Q24H  . Fluticasone-Salmeterol  1 puff Inhalation BID  . furosemide  40 mg Intravenous Once  . levalbuterol  0.63 mg Nebulization QID  . levothyroxine  44 mcg Intravenous QAC breakfast  . lidocaine  1 patch Transdermal Q24H  . mirtazapine  15 mg Oral QHS  . pantoprazole (PROTONIX) IV  40 mg Intravenous Daily  . tiotropium  18 mcg Inhalation Daily    Assessment: 67 YOF with new Afib to start Lovenox d/t inability to take PO meds. INR remains supratherapeutic and trending up although no coumadin was given in the past 4 days. The decreased coumadin metabolism is likely caused by drug-drug interaction with Azithromycin in the settings of poor PO intake. No new CBC, no bleeding noted  Goal of Therapy:  Start Lovenox when INR < 2    Plan:  - Continue hold coumadin today,  - F/U INR and start Lovenox  once INR < 2  - F/U plan restart longterm anticoagulation (ASA vs Coumadin)  - May consider low dose vitamin K 2.5mg  IV to reverse INR if EGD with biopsy tomorrow    Riki Rusk 06/07/2011,10:54 AM

## 2011-06-07 NOTE — Progress Notes (Signed)
Subjective: More Hypoxic and more issues overnight.  Increased FIO2, suctioned and Neb given. She is sharp. She denies pain or suffering. She wants food/coffee but understands why we are not giving it.  Surrounded by family. SOB/Tachypneia - weraing 50% venturi mask  Sitting up talking with family   Objective: Vital signs in last 24 hours: Temp:  [98 F (36.7 C)-98.3 F (36.8 C)] 98.2 F (36.8 C) (01/13 0600) Pulse Rate:  [84-88] 84  (01/13 0600) Resp:  [18-20] 20  (01/13 0600) BP: (94-117)/(60-74) 95/62 mmHg (01/13 0600) SpO2:  [93 %-96 %] 93 % (01/13 0600) FiO2 (%):  [50 %] 50 % (01/13 1150) Weight change:  Last BM Date: 06/03/11  Intake/Output from previous day: 01/12 0701 - 01/13 0700 In: -  Out: 200 [Urine:200] Intake/Output this shift:   General appearance: alert, cooperative, Resp: distant - rhonhi Chest wall: no tenderness  Cardio: irregularly irregular rhythm  GI: soft, non-tender; bowel sounds normal; no masses, no organomegaly  Extremities: extremities normal, atraumatic, no cyanosis or edema    Lab Results:  Basename 06/06/11 0530 06/05/11 0550  WBC 13.9* 13.5*  HGB 13.3 13.0  HCT 41.5 40.4  PLT 331 315   BMET  Basename 06/07/11 0530 06/06/11 0530  NA 133* 134*  K 3.1* 3.8  CL 100 101  CO2 28 22  GLUCOSE 102* 64*  BUN 9 17  CREATININE 0.47* 0.49*  CALCIUM 7.5* 7.9*    Studies/Results: Dg Chest 2 View  06/07/2011  *RADIOLOGY REPORT*  Clinical Data: Evaluate for presence of contents in the esophagus. Follow-up pneumonia.  History of COPD, hypertension.  CHEST - 2 VIEW  Comparison: 06/06/2011, 06/03/2011  Findings: The lungs are hyperinflated.  The heart is mildly enlarged.  There are bilateral pleural effusions, left greater than right.  Bilateral infiltrates are present, right greater than left. Overall, aeration has improved since previous exam.  On the erect lateral, there is probable debris within the esophagus.  IMPRESSION:  1.  Bilateral  infiltrates and effusions with some improvement in aeration. 2.  Persistent debris within the esophagus, consistent with distal esophageal obstruction as seen on prior contrast swallow.  Original Report Authenticated By: Patterson Hammersmith, M.D.   Dg Chest Port 1 View  06/06/2011  *RADIOLOGY REPORT*  Clinical Data: Decreased O2 saturation; history of smoking.  PORTABLE CHEST - 1 VIEW  Comparison: Chest radiograph performed 06/05/2011  Findings: There is a persistent small left pleural effusion. Worsening bibasilar airspace opacification is noted, raising concern for pneumonia.  Edema could have a similar appearance, though persistent biapical sparing suggests against edema.  No pneumothorax is identified.  The cardiomediastinal silhouette is borderline normal in size.  No acute osseous abnormalities are seen.  IMPRESSION: Worsening bibasilar airspace opacification, with persistent small left pleural effusion.  Findings raise concern for pneumonia. Edema could have a similar appearance, but is considered less likely.  Original Report Authenticated By: Tonia Ghent, M.D.    Medications:  I have reviewed the patient's current medications. Scheduled:    . antiseptic oral rinse  15 mL Mouth Rinse BID  . azithromycin  500 mg Intravenous Q24H  . cefTRIAXone (ROCEPHIN)  IV  1 g Intravenous Q24H  . Fluticasone-Salmeterol  1 puff Inhalation BID  . furosemide  40 mg Intravenous Once  . furosemide  40 mg Intravenous Once  . levalbuterol  0.63 mg Nebulization QID  . levothyroxine  44 mcg Intravenous QAC breakfast  . lidocaine  1 patch Transdermal Q24H  . mirtazapine  15 mg Oral QHS  . pantoprazole (PROTONIX) IV  40 mg Intravenous Daily  . potassium chloride  10 mEq Intravenous Q1 Hr x 4  . tiotropium  18 mcg Inhalation Daily   Continuous:    . dextrose 5 % and 0.9% NaCl 50 mL/hr at 06/06/11 2032  . diltiazem (CARDIZEM) infusion 5 mg/hr (06/06/11 1801)   JXB:[id number], morphine injection,  ondansetron (ZOFRAN) IV  Assessment/Plan: 1) Worsening Multilobar Pneumonia/Asp PNA. Has completed a 7 day course of abx from her admission on 1/10 and was d/ced 3 days ago and then restarted. WIll continue for now.  2) Worsening Hypoxemia - work on Oxygenation.  She is comfortable despite her setbacks.  If she starts to suffer or worsens further we may need to progress towards a hospice palliative care approach. 3) Afib/Flutter. Rate controlled on Cardizem.  WIll convert to Lovenox once INR <2 as cannot do oral Coumadin. I know it is available IV, but given these comorbidities, I wonder if anticoagulation vs ASA only once esophagus open would be a better option for safety reasons.  Cards to help with decision making. 4) Osteoporosis: As above.  5) Dysphagia/Weight loss.Imaging shows an esophagus filled with food. GI  Will do EGD tomorrow - it is risky and pt/family understand.  Procardia SL made her too dizzy.  Considering NG if EGD works. 6) HTN Controlled, Cardizem as well.  7) WBC stable at 13.5-13.9 8) Herpetic neuralgia:Neurontin held due to limited PO  Morphine PRN 9) PT/OT incentive spirometer. Clear liquid sips to help, keep upright today. 10) Hypokalemia - K 11) She was also Hypoglycemic yesterday and had D50 and D5 NS started.   LOS: 11 days   Michelle Hopkins M 06/07/2011, 12:26 PM

## 2011-06-07 NOTE — Progress Notes (Signed)
SUBJECTIVE:  Sitting up in chair, mildly SOB but appears comfortable  OBJECTIVE:   Vitals:   Filed Vitals:   06/06/11 1400 06/06/11 2001 06/06/11 2100 06/07/11 0600  BP: 117/74  94/60 95/62  Pulse: 88  86 84  Temp: 98.3 F (36.8 C)  98 F (36.7 C) 98.2 F (36.8 C)  TempSrc: Oral     Resp: 20  18 20   Height:      Weight:      SpO2: 96% 94% 96% 93%   I&O's:  No intake or output data in the 24 hours ending 06/07/11 1142 TELEMETRY: Reviewed telemetry pt in atrial flutter with CVR     PHYSICAL EXAM General: Well developed, well nourished, in no acute distress Head: Eyes PERRLA, No xanthomas.   Normal cephalic and atramatic  Lungs:   Clear anteriorly Heart:  Irregularly irregular S1 S2 Pulses are 2+ & equal.            No carotid bruit. No JVD.  No abdominal bruits. No femoral bruits. Abdomen: Bowel sounds are positive, abdomen soft and non-tender without masses or                  Hernia's noted. Msk:  Back normal, normal gait. Normal strength and tone for age. Extremities:   No clubbing, cyanosis or edema.  DP +1 Neuro: Alert and oriented X 3. Psych:  Good affect, responds appropriately   LABS: Basic Metabolic Panel:  Basename 06/07/11 0530 06/06/11 0530  NA 133* 134*  K 3.1* 3.8  CL 100 101  CO2 28 22  GLUCOSE 102* 64*  BUN 9 17  CREATININE 0.47* 0.49*  CALCIUM 7.5* 7.9*  MG -- --  PHOS -- --   Liver Function Tests:  [place] 06/06/11 0530 06/05/11 0550  AST 22 29  ALT 31 38*  ALKPHOS 114 122*  BILITOT 0.2* 0.3  PROT 5.4* 5.4*  ALBUMIN 2.5* 2.5*    CBC:  Basename 06/06/11 0530 06/05/11 0550  WBC 13.9* 13.5*  NEUTROABS -- --  HGB 13.3 13.0  HCT 41.5 40.4  MCV 86.8 86.5  PLT 331 315    Lab Results  Component Value Date   INR 3.72* 06/07/2011   INR 3.22* 06/06/2011   INR 2.66* 06/05/2011    RADIOLOGY: Dg Chest 1 View  05/29/2011  *RADIOLOGY REPORT*  Clinical Data: Follow up pneumonia, weakness  CHEST - 1 VIEW  Comparison: CT scan of the chest  05/27/2011  Findings: Cardiomediastinal silhouette is stable.  Bilateral patchy infiltrate/pneumonia in the lower lobe again noted.  Stable small bilateral pleural effusions left greater than right.  No convincing pulmonary edema.  There is mild dextroscoliosis upper thoracic spine and mild levoscoliosis lower thoracic spine.  Mild hyperinflation again noted.  IMPRESSION: Bilateral patchy infiltrate/pneumonia and lower lobe again noted. Stable bilateral small pleural effusions left greater than right. Thoracic spine scoliosis.  Original Report Authenticated By: Natasha Mead, M.D.   Dg Chest 2 View  06/07/2011  *RADIOLOGY REPORT*  Clinical Data: Evaluate for presence of contents in the esophagus. Follow-up pneumonia.  History of COPD, hypertension.  CHEST - 2 VIEW  Comparison: 06/06/2011, 06/03/2011  Findings: The lungs are hyperinflated.  The heart is mildly enlarged.  There are bilateral pleural effusions, left greater than right.  Bilateral infiltrates are present, right greater than left. Overall, aeration has improved since previous exam.  On the erect lateral, there is probable debris within the esophagus.  IMPRESSION:  1.  Bilateral infiltrates and effusions  with some improvement in aeration. 2.  Persistent debris within the esophagus, consistent with distal esophageal obstruction as seen on prior contrast swallow.  Original Report Authenticated By: Patterson Hammersmith, M.D.   Dg Chest 2 View  06/02/2011  *RADIOLOGY REPORT*  Clinical Data: Short of breath  CHEST - 2 VIEW  Comparison: 05/31/2011  Findings: Severe COPD with scarring.  Improved aeration of the lung bases.  Improvement in bilateral effusions.  Vascular congestion has resolved.  IMPRESSION: Improving congestive heart failure with bilateral effusions.  Underlying pneumonia not excluded but the findings may all be due to heart failure.  Original Report Authenticated By: Camelia Phenes, M.D.   Ct Head Wo Contrast  05/27/2011  *RADIOLOGY REPORT*   Clinical Data: Optic nerves swelling.  Visual loss.  Abnormal chest x-ray.  CT HEAD WITHOUT CONTRAST  Technique:  Contiguous axial images were obtained from the base of the skull through the vertex without contrast.  Comparison: None.  Findings: There is diffuse cerebral atrophy.  Ventricular dilatation likely representing central atrophy.  No mass effect or midline shift.  No abnormal extra-axial fluid collections.  Wallace Cullens- white matter junctions are distinct.  Basal cisterns are not effaced.  No evidence of acute intracranial hemorrhage.  Vascular calcifications.  Opacification of the right frontal sinus. Paranasal sinuses are otherwise not opacified.  No depressed skull fractures.  The globes appear intact with irregular contours.  No infiltration in the retrobulbar fat.  IMPRESSION: Cerebral atrophy.  No evidence of acute intracranial hemorrhage, mass lesion, or acute infarct.  Nothing to suggest increased intracranial pressure. Opacification of the right frontal sinus.  Original Report Authenticated By: Marlon Pel, M.D.   Ct Chest W Contrast  05/27/2011  *RADIOLOGY REPORT*  Clinical Data: Abnormal chest x-ray.  Hypoxemia with weight loss.  CT CHEST WITH CONTRAST  Technique:  Multidetector CT imaging of the chest was performed following the standard protocol during bolus administration of intravenous contrast.  Contrast: 80mL OMNIPAQUE IOHEXOL 300 MG/ML IV SOLN  Comparison: Chest radiographs 10/12/2005 and 09/30/2006.  No recent studies available.  Findings: The patient appears cachectic.  Small mediastinal and hilar lymph nodes are not pathologically enlarged.  There are is mild diffuse aortic atherosclerosis.  The pulmonary arteries appear patent.  There are small left and trace right pleural effusions.  There is no significant pericardial effusion.  There are dependent airspace opacities in both lower lobes with consolidation and air bronchograms.  In addition, there are patchy opacities within the  lingula and right middle lobe.  Underlying emphysematous changes are present.  No endobronchial lesion is identified.  The visualized upper abdomen is unremarkable.  There is no evidence of adrenal mass.  There is a mild scoliosis.  IMPRESSION:  1.  Multifocal air space opacities in both lungs most consistent with multilobar pneumonia. 2.  Small left greater than right pleural effusions. 3.  No dominant mass, endobronchial lesion or significant lymphadenopathy.  Radiographic followup is recommended to exclude an atypical neoplastic process.  Original Report Authenticated By: Gerrianne Scale, M.D.   Dg Esophagus  06/03/2011  *RADIOLOGY REPORT*  Clinical Data: Regurgitation, vomiting.  Question stricture.  ESOPHOGRAM/BARIUM SWALLOW  Technique:  Single contrast examination was performed using thin barium.  Fluoroscopy time:  1.46 minutes.  Comparison:  CT 05/27/2011  Findings:  The esophagus is nearly completely obstructed distally. Only a small amount of contrast material passes beyond a distal obstruction and into the stomach.  Extensive food material is seen within the dilated esophagus.  Large filling defects noted distally in the esophagus.  It is unknown whether this represents a mass or possibly impacted food at a severe distal esophageal stricture.  IMPRESSION: Near complete obstruction of the distal esophagus with a large amount of debris and food material within the dilated esophagus. Only a small amount of contrast passes into the stomach.  Recommend endoscopy for further evaluation.  These results were called to Dr. Wylene Simmer at the time of interpretation.  Original Report Authenticated By: Cyndie Chime, M.D.   Dg Chest Port 1 View  06/06/2011  *RADIOLOGY REPORT*  Clinical Data: Decreased O2 saturation; history of smoking.  PORTABLE CHEST - 1 VIEW  Comparison: Chest radiograph performed 06/05/2011  Findings: There is a persistent small left pleural effusion. Worsening bibasilar airspace opacification  is noted, raising concern for pneumonia.  Edema could have a similar appearance, though persistent biapical sparing suggests against edema.  No pneumothorax is identified.  The cardiomediastinal silhouette is borderline normal in size.  No acute osseous abnormalities are seen.  IMPRESSION: Worsening bibasilar airspace opacification, with persistent small left pleural effusion.  Findings raise concern for pneumonia. Edema could have a similar appearance, but is considered less likely.  Original Report Authenticated By: Tonia Ghent, M.D.   Dg Chest Port 1 View  06/05/2011  *RADIOLOGY REPORT*  Clinical Data: Shortness of breath  PORTABLE CHEST - 1 VIEW  Comparison: 06/03/2011  Findings: Hyperexpansion is consistent with emphysema. The cardiopericardial silhouette is enlarged.  There is bibasilar atelectasis or infiltrate with small bilateral pleural effusions. Contrast and debris is seen in the mid and distal esophagus consistent with esophageal obstruction diagnosed on esophagram 2 days ago. Telemetry leads overlie the chest.  IMPRESSION: Emphysema with bibasilar atelectasis/infiltrate and small bilateral pleural effusions.  Original Report Authenticated By: ERIC A. MANSELL, M.D.   Dg Chest Port 1 View  06/03/2011  *RADIOLOGY REPORT*  Clinical Data: Nausea, vomiting, low oxygen saturation  PORTABLE CHEST - 1 VIEW  Comparison: Portable exam 1057 hours compared to 06/02/2011  Findings: Upper-normal size of cardiac silhouette. Prominent central pulmonary arteries stable. Mediastinal contours normal. Severe emphysematous changes. Chronic bibasilar opacities left greater than right. Minimal perihilar infiltrates/edema. Small left pleural effusion. No pneumothorax. Diffuse osseous demineralization.  IMPRESSION: COPD with suspect mild CHF. Bibasilar opacities, particularly on left, either representing consolidation versus atelectasis left lower lobe. Small left pleural effusion.  Original Report Authenticated By: Lollie Marrow, M.D.   Dg Chest Port 1 View  05/31/2011  *RADIOLOGY REPORT*  Clinical Data: Pneumonia  PORTABLE CHEST - 1 VIEW  Comparison: 05/29/2011  Findings: Background COPD/emphysema noted.  Stable bibasilar airspace disease and left lower lobe dense consolidation. Persistent left greater than right effusion.  No change in upper lobe aeration.  No pneumothorax.  Trachea is midline.  Degenerative changes of the spine with an associated scoliosis.  IMPRESSION: No significant change in bibasilar pneumonia, left lower lobe consolidation, and pleural effusions.  Original Report Authenticated By: Judie Petit. Ruel Favors, M.D.      ASSESSMENT:  1.  Pneumonia on antibiotics 2.  Atrial flutter/fib rate controlled 3.  Acute on chronic diastolic heart failure - improved but still SOB 4.  Hypokalemia 5.  Esophageal obstruction  PLAN:   1.  Lasix IV today 2.  Check BMET and BNP in am 3.  Replete potassium  Quintella Reichert, MD  06/07/2011  11:42 AM

## 2011-06-07 NOTE — Progress Notes (Signed)
Subjective SOB, hungry, family around, appears dyspneic as yesterday  Objective: Vital signs in last 24 hours: Temp:  [98 F (36.7 C)-98.3 F (36.8 C)] 98.2 F (36.8 C) (01/13 0600) Pulse Rate:  [84-88] 84  (01/13 0600) Resp:  [18-20] 20  (01/13 0600) BP: (94-117)/(60-74) 95/62 mmHg (01/13 0600) SpO2:  [93 %-96 %] 93 % (01/13 0600) FiO2 (%):  [50 %] 50 % (01/13 0811) Last BM Date: 06/03/11 General:   Alert,  pleasant, cooperative in NAD, thin, Head:  Normocephalic and atraumatic. Eyes:  Sclera clear, no icterus.   Conjunctiva pink. Mouth:  No deformity or lesions, dentition normal. Neck:  Supple;  Heart:  Regular rate and rhythm; no murmurs, clicks, rubs,  or gallops. Lungs:  No wheezes or rales Abdomen:  Scaphoid, soft, non tender  Extremities:  Without clubbing or edema. Neurologic:  Alert and  oriented x4;  grossly normal neurologically. Skin:  Intact without significant lesions or rashes.  Intake/Output from previous day: 01/12 0701 - 01/13 0700 In: -  Out: 200 [Urine:200] Intake/Output this shift:    Lab Results:  Basename 06/06/11 0530 06/05/11 0550  WBC 13.9* 13.5*  HGB 13.3 13.0  HCT 41.5 40.4  PLT 331 315   BMET  Basename 06/07/11 0530 06/06/11 0530 06/05/11 0550  NA 133* 134* 135  K 3.1* 3.8 4.1  CL 100 101 100  CO2 28 22 27   GLUCOSE 102* 64* 76  BUN 9 17 22   CREATININE 0.47* 0.49* 0.55  CALCIUM 7.5* 7.9* 8.0*   LFT  Basename 06/06/11 0530  PROT 5.4*  ALBUMIN 2.5*  AST 22  ALT 31  ALKPHOS 114  BILITOT 0.2*  BILIDIR --  IBILI --   PT/INR  Basename 06/07/11 0530 06/06/11 0530  LABPROT 37.4* 33.4*  INR 3.72* 3.22*   Hepatitis Panel No results found for this basename: HEPBSAG,HCVAB,HEPAIGM,HEPBIGM in the last 72 hours  Studies/Results: Dg Chest 2 View  06/07/2011  *RADIOLOGY REPORT*  Clinical Data: Evaluate for presence of contents in the esophagus. Follow-up pneumonia.  History of COPD, hypertension.  CHEST - 2 VIEW  Comparison:  06/06/2011, 06/03/2011  Findings: The lungs are hyperinflated.  The heart is mildly enlarged.  There are bilateral pleural effusions, left greater than right.  Bilateral infiltrates are present, right greater than left. Overall, aeration has improved since previous exam.  On the erect lateral, there is probable debris within the esophagus.  IMPRESSION:  1.  Bilateral infiltrates and effusions with some improvement in aeration. 2.  Persistent debris within the esophagus, consistent with distal esophageal obstruction as seen on prior contrast swallow.  Original Report Authenticated By: Patterson Hammersmith, M.D.   Dg Chest Port 1 View  06/06/2011  *RADIOLOGY REPORT*  Clinical Data: Decreased O2 saturation; history of smoking.  PORTABLE CHEST - 1 VIEW  Comparison: Chest radiograph performed 06/05/2011  Findings: There is a persistent small left pleural effusion. Worsening bibasilar airspace opacification is noted, raising concern for pneumonia.  Edema could have a similar appearance, though persistent biapical sparing suggests against edema.  No pneumothorax is identified.  The cardiomediastinal silhouette is borderline normal in size.  No acute osseous abnormalities are seen.  IMPRESSION: Worsening bibasilar airspace opacification, with persistent small left pleural effusion.  Findings raise concern for pneumonia. Edema could have a similar appearance, but is considered less likely.  Original Report Authenticated By: Tonia Ghent, M.D.     ASSESSMENT:   Principal Problem:  *Pneumonia Active Problems:   Atrial fibrillation  Elevated transaminase level  Acute on chronic diastolic heart failure  Esophageal obstruction  Nonspecific (abnormal) findings on radiological and other examination of gastrointestinal tract  Foreign body in esophagus     PLAN:   Severe respiratory compromise  limits our ability to sedate patient for EGD,trial of desimpaction of the esophagus. However, I have talked to the  family extensively and they wish to proceed with EGD to see if food could be pushed down. She is on Coumadin. Her CXR today still shows food in the esophagus but it looks like the impaction has moved dowm to lower third of the esophagus. She was able to swallow sip of water without spitting it up, I have set her up tentatively for EGD/removal of foreign body tomorrow     LOS: 11 days   Lina Sar  06/07/2011, 10:47 AM

## 2011-06-08 ENCOUNTER — Encounter (HOSPITAL_COMMUNITY)
Admission: AD | Disposition: A | Discharge status: Skilled Nursing Facility | Payer: Self-pay | Source: Ambulatory Visit | Attending: Internal Medicine

## 2011-06-08 DIAGNOSIS — I4891 Unspecified atrial fibrillation: Secondary | ICD-10-CM

## 2011-06-08 DIAGNOSIS — T18108A Unspecified foreign body in esophagus causing other injury, initial encounter: Secondary | ICD-10-CM

## 2011-06-08 LAB — PROTIME-INR
INR: 3.27 — ABNORMAL HIGH (ref 0.00–1.49)
Prothrombin Time: 33.8 seconds — ABNORMAL HIGH (ref 11.6–15.2)

## 2011-06-08 LAB — PREPARE FRESH FROZEN PLASMA: Unit division: 0

## 2011-06-08 LAB — PRO B NATRIURETIC PEPTIDE: Pro B Natriuretic peptide (BNP): 837 pg/mL — ABNORMAL HIGH (ref 0–450)

## 2011-06-08 LAB — TYPE AND SCREEN: Antibody Screen: NEGATIVE

## 2011-06-08 LAB — BASIC METABOLIC PANEL
Calcium: 8.1 mg/dL — ABNORMAL LOW (ref 8.4–10.5)
Chloride: 100 mEq/L (ref 96–112)
Creatinine, Ser: 0.46 mg/dL — ABNORMAL LOW (ref 0.50–1.10)
GFR calc Af Amer: >90 mL/min (ref >90)
Sodium: 133 mEq/L — ABNORMAL LOW (ref 135–145)

## 2011-06-08 SURGERY — ESOPHAGOGASTRODUODENOSCOPY (EGD) WITH ESOPHAGEAL DILATION
Anesthesia: Moderate Sedation

## 2011-06-08 MED ORDER — VITAMIN K1 10 MG/ML IJ SOLN
5.0000 mg | Freq: Once | INTRAMUSCULAR | Status: AC
  Administered 2011-06-08: 10 mg via SUBCUTANEOUS
  Filled 2011-06-08: qty 0.5

## 2011-06-08 NOTE — Progress Notes (Signed)
OT Note:  Pt unable to tolerate OT /activity today due to ongoing esophageal issues.  Will sign off at this time.  Please reorder when appropriate. 06/08/2011 Martie Round, OTR/L Pager: [phone]

## 2011-06-08 NOTE — Progress Notes (Signed)
Patient Name: Michelle Hopkins      SUBJECTIVE: amazingly up beat., some shortness of breath and impatience with having to wait although understands the reasoning  Past Medical History  Diagnosis Date  . COPD (chronic obstructive pulmonary disease)   . HTN (hypertension)   . Hypothyroidism   . Osteoporosis   . Postherpetic neuralgia     PHYSICAL EXAM Filed Vitals:   06/07/11 2130 06/08/11 0600 06/08/11 0905 06/08/11 1210  BP: 98/66 98/61    Pulse: 86 71    Temp: 98.7 F (37.1 C) 98.5 F (36.9 C)    TempSrc:      Resp: 14 20    Height:      Weight:      SpO2: 96% 97% 95% 95%    Well developed and nourished in no acute distress Good air movement Irregularly irregular rate and rhythm with controlled ventricular response, no murmurs or gallops Abd-soft with active BS   No Clubbing cyanosis edema Skin-warm and dry A & Oriented  Grossly normal sensory and motor function  TELEMETRY: Reviewed telemetry pt in afib with reasonable control of VR    Intake/Output Summary (Last 24 hours) at 06/08/11 1423 Last data filed at 06/07/11 1700  Gross per 24 hour  Intake      0 ml  Output    800 ml  Net   -800 ml    LABS: Basic Metabolic Panel:  Lab 06/08/11 1610 06/07/11 1725 06/07/11 0530 06/06/11 0530 06/05/11 0550 06/04/11 0550 06/03/11 0605  NA 133* 132* 133* 134* 135 131* 133*  K 3.8 4.2 3.1* 3.8 4.1 4.3 4.9  CL 100 96 100 101 100 91* 95*  CO2 27 24 28 22 27 31  33*  GLUCOSE 96 92 102* 64* 76 89 80  BUN 9 7 9 17 22 23 20   CREATININE 0.46* 0.46* 0.47* 0.49* 0.55 0.67 0.66  CALCIUM 8.1* 7.8* -- -- -- -- --  MG -- -- -- -- -- -- --  PHOS -- -- -- -- -- -- --   Cardiac Enzymes: No results found for this basename: CKTOTAL:3,CKMB:3,CKMBINDEX:3,TROPONINI:3 in the last 72 hours CBC:  Lab 06/06/11 0530 06/05/11 0550 06/04/11 0550 06/03/11 0605 06/02/11 0358  WBC 13.9* 13.5* 13.4* 13.0* 11.6*  NEUTROABS -- -- -- -- --  HGB 13.3 13.0 13.9 12.6 11.9*  HCT 41.5 40.4 42.5  38.7 37.2  MCV 86.8 86.5 86.0 86.6 86.9  PLT 331 315 368 315 299   PROTIME:  Basename 06/08/11 0620 06/07/11 0530 06/06/11 0530  LABPROT 33.8* 37.4* 33.4*  INR 3.27* 3.72* 3.22*   Liver Function Tests:  Basename 06/06/11 0530  AST 22  ALT 31  ALKPHOS 114  BILITOT 0.2*  PROT 5.4*  ALBUMIN 2.5*   No results found for this basename: LIPASE:2,AMYLASE:2 in the last 72 hours BNP: No components found with this basename: POCBNP:5 D-Dimer: No results found for this basename: DDIMER:2 in the last 72 hours Hemoglobin A1C: No results found for this basename: HGBA1C in the last 72 hours Fasting Lipid Panel: No results found for this basename: CHOL,HDL,LDLCALC,TRIG,CHOLHDL,LDLDIRECT in the last 72 hours Thyroid Function Tests: No results found for this basename: TSH,T4TOTAL,FREET3,T3FREE,THYROIDAB in the last 72 hours Anemia Panel: No results found for this basename: VITAMINB12,FOLATE,FERRITIN,TIBC,IRON,RETICCTPCT in the last 72 hours   ASSESSMENT AND PLAN:  Patient Active Hospital Problem List: Pneumonia (05/27/2011)   Esophageal obstruction (06/04/2011)    Atrial fibrillation (05/27/2011) reasonably rate controlled   As relates to anticoagulation, I would feel fine  with stopping coumadin and not using ASA  There are no furhter guidelines re asa in this situation.  Whether she should be on coumadin may best be deferred  She will need scdS ONCE  INR<2.0          Signed, Sherryl Manges MD  06/08/2011

## 2011-06-08 NOTE — Progress Notes (Signed)
[facility] Daily Rounding Note 06/08/2011, 9:39 AM  SUBJECTIVE: Note INR is 3.2. Slept well overnight.  No cmplaints.  Wants to move forward with EGD.  OBJECTIVE: General:  Frail, ill appearing.  No acute distress.   Vital signs in last 24 hours: Temp:  [98.5 F (36.9 C)-98.7 F (37.1 C)] 98.5 F (36.9 C) (01/14 0600) Pulse Rate:  [71-86] 71  (01/14 0600) Resp:  [14-20] 20  (01/14 0600) BP: (98)/(61-66) 98/61 mmHg (01/14 0600) SpO2:  [95 %-97 %] 95 % (01/14 0905) FiO2 (%):  [50 %] 50 % (01/14 0905) Last BM Date: 06/03/11  Heart: Irreg/Irreg, rate not accelerated.  Chest: on oxygen via venturi mask  Abdomen: soft, non-distende, non-tender.  Active BS.    Extremities: no edema.  Neuro/Psych:  Pleasant, engaged.  Intake/Output from previous day: 01/13 0701 - 01/14 0700 In: -  Out: 800 [Urine:800]  Intake/Output this shift:    Lab Results:  Basename 06/06/11 0530  WBC 13.9*  HGB 13.3  HCT 41.5  PLT 331   BMET  Basename 06/08/11 0620 06/07/11 1725 06/07/11 0530  NA 133* 132* 133*  K 3.8 4.2 3.1*  CL 100 96 100  CO2 27 24 28   GLUCOSE 96 92 102*  BUN 9 7 9   CREATININE 0.46* 0.46* 0.47*  CALCIUM 8.1* 7.8* 7.5*   LFT  Basename 06/06/11 0530  PROT 5.4*  ALBUMIN 2.5*  AST 22  ALT 31  ALKPHOS 114  BILITOT 0.2*  BILIDIR --  IBILI --   PT/INR  Basename 06/08/11 0620 06/07/11 0530  LABPROT 33.8* 37.4*  INR 3.27* 3.72*   Studies/Results: Dg Chest 2 View  06/07/2011  *RADIOLOGY REPORT*  Clinical Data: Evaluate for presence of contents in the esophagus. Follow-up pneumonia.  History of COPD, hypertension.  CHEST - 2 VIEW  Comparison: 06/06/2011, 06/03/2011  Findings: The lungs are hyperinflated.  The heart is mildly enlarged.  There are bilateral pleural effusions, left greater than right.  Bilateral infiltrates are present, right greater than left. Overall, aeration has improved since previous exam.  On the erect lateral, there is probable debris within  the esophagus.  IMPRESSION:  1.  Bilateral infiltrates and effusions with some improvement in aeration. 2.  Persistent debris within the esophagus, consistent with distal esophageal obstruction as seen on prior contrast swallow.  Original Report Authenticated By: Patterson Hammersmith, M.D.    ASSESMENT: 1. Severe dysphagia with retained esophageal contents. ? Dysmotility vs mass?  She has been without significant nutrition for several days, and poor intake PTA.  Is on schedule for EGD, possible esophageal dilatation today.  However may be prudent to postpone given INR of 3.2.  Noted that patient does not want feeding tube.  2. Pneumonia, aspiration. On rocephin and zithromax.  3. A Fib, RVR. Coumadin started this admit, subsequently dc'd but INR is still in effect and she is slightly supra therapeutic. 4. Diastolic heart failure. 5. Hypothyroidism, on IV Synthroid.   PLAN: 1.  Cancel EGD.   ?give low dose Vit K IV?     LOS: 12 days   Jennye Moccasin  06/08/2011, 9:39 AM Pager: [phone]   I have seen and examined the patient also - and discussed situation with her and son. Very difficult situation with subacute if not chronic food impaction. I suspect severe dysmotility or achalasia but really do not know.   Hope to have her undergo an EGD with diagnosis and possibly treatment (? Dilation, ? Relief of food  impaction) but that is not certain. Would like to prepare for possible dilation also. Dr. Graciela Husbands has indicated warfarin can be dced permanatly i her circumstances no anticoagulation seems appropriate. Was given vitamin K but I will also give FFP to see if we can get her to EGD tomorrow.  She and son understand EGD with no sedation given respiratory problems. No guarantees of success, risk of aspiration present but less than with sedation. General anesthesia not thought to be safe, either - ? Prolonged ventilation with the pneumonia.

## 2011-06-08 NOTE — Progress Notes (Signed)
Speech Language Pathology  Aware of EGD planned for today.  Await results to determine if any further oral-pharyngeal swallow assessment is needed.  Myra Rude, M.S.,CCC-SLP Pager [phone]

## 2011-06-08 NOTE — Progress Notes (Signed)
Subjective: Had more trouble with breathing this weekend, presumably reaspirated a bit on Friday.  Stable otherwise.  Has indicated to family members that she was not wanting a feeding tube under any circumstances.  We had discussed the l;ikelihood of needing liquid diet and the possibility that it might not provide all of her caloric needs due to volume limitations.  Objective: Vital signs in last 24 hours: Temp:  [98.5 F (36.9 C)-98.7 F (37.1 C)] 98.5 F (36.9 C) (01/14 0600) Pulse Rate:  [71-86] 71  (01/14 0600) Resp:  [14-20] 20  (01/14 0600) BP: (98)/(61-66) 98/61 mmHg (01/14 0600) SpO2:  [96 %-97 %] 97 % (01/14 0600) FiO2 (%):  [50 %] 50 % (01/14 0600) Weight change:  Last BM Date: 06/03/11  Intake/Output from previous day: 01/13 0701 - 01/14 0700 In: -  Out: 800 [Urine:800] Intake/Output this shift:   General appearance: alert, cooperative,  Resp: distant - rhonhi, on face mask oxygen Chest wall: no tenderness  Cardio: irregularly irregular rhythm  GI: soft, non-tender; bowel sounds normal; no masses, no organomegaly  Extremities: extremities normal, atraumatic, no cyanosis or edema    Lab Results:  Basename 06/06/11 0530  WBC 13.9*  HGB 13.3  HCT 41.5  PLT 331   BMET  Basename 06/07/11 1725 06/07/11 0530  NA 132* 133*  K 4.2 3.1*  CL 96 100  CO2 24 28  GLUCOSE 92 102*  BUN 7 9  CREATININE 0.46* 0.47*  CALCIUM 7.8* 7.5*    Studies/Results: Dg Chest 2 View  06/07/2011  *RADIOLOGY REPORT*  Clinical Data: Evaluate for presence of contents in the esophagus. Follow-up pneumonia.  History of COPD, hypertension.  CHEST - 2 VIEW  Comparison: 06/06/2011, 06/03/2011  Findings: The lungs are hyperinflated.  The heart is mildly enlarged.  There are bilateral pleural effusions, left greater than right.  Bilateral infiltrates are present, right greater than left. Overall, aeration has improved since previous exam.  On the erect lateral, there is probable debris within  the esophagus.  IMPRESSION:  1.  Bilateral infiltrates and effusions with some improvement in aeration. 2.  Persistent debris within the esophagus, consistent with distal esophageal obstruction as seen on prior contrast swallow.  Original Report Authenticated By: Patterson Hammersmith, M.D.    Medications:  I have reviewed the patient's current medications. Scheduled:   . antiseptic oral rinse  15 mL Mouth Rinse BID  . azithromycin  500 mg Intravenous Q24H  . cefTRIAXone (ROCEPHIN)  IV  1 g Intravenous Q24H  . Fluticasone-Salmeterol  1 puff Inhalation BID  . furosemide  40 mg Intravenous Once  . levalbuterol  0.63 mg Nebulization QID  . levothyroxine  44 mcg Intravenous QAC breakfast  . lidocaine  1 patch Transdermal Q24H  . mirtazapine  15 mg Oral QHS  . pantoprazole (PROTONIX) IV  40 mg Intravenous Daily  . potassium chloride  10 mEq Intravenous Q1 Hr x 4  . tiotropium  18 mcg Inhalation Daily   Continuous:   . dextrose 5 % and 0.9% NaCl 50 mL/hr at 06/07/11 2140  . diltiazem (CARDIZEM) infusion 5 mg/hr (06/07/11 1527)   ZOX:[id number], morphine injection, ondansetron (ZOFRAN) IV  Assessment/Plan: 1) Worsening Multilobar Pneumonia/Asp PNA. Has completed a 7 day course of abx from her admission on 1/10 and was d/ced 3 days ago and then restarted. WIll continue for now.  2) Worsening Hypoxemia - work on Oxygenation. She is comfortable despite her setbacks. If she starts to suffer or worsens further we  may need to progress towards a hospice palliative care approach. Especially if she cannot maintain nutrition 3) Afib/Flutter. Rate controlled on Cardizem. WIll convert to Lovenox once INR <2 as cannot do oral Coumadin. I know it is available IV, but given these comorbidities, I wonder if anticoagulation vs ASA only once esophagus open would be a better option for safety reasons. Cards to help with decision making.  4) Osteoporosis: As above.  5) Dysphagia/Weight loss.Imaging shows an  esophagus filled with food. GI Will do EGD later today - it is risky and pt/family understand, and was reiterated this morning as well.  Options are very limited however.  . Procardia SL made her too dizzy. Considering NG if EGD works.  6) HTN Controlled, Cardizem as well.  7) WBC stable  8) Herpetic neuralgia:Neurontin held due to limited PO Morphine PRN  9) PT/OT incentive spirometer, NPO, keep upright  LOS: 12 days   Michelle Hopkins W 06/08/2011, 7:46 AM

## 2011-06-08 NOTE — Progress Notes (Signed)
PT Cancellation Note  Treatment cancelled today due to patient's refusal to participate. Family present and they and pt states she is too weak to participate today.  Michelle Hopkins 06/08/2011, 2:45 PM

## 2011-06-09 ENCOUNTER — Other Ambulatory Visit: Payer: Self-pay | Admitting: Internal Medicine

## 2011-06-09 ENCOUNTER — Encounter (HOSPITAL_COMMUNITY)
Admission: AD | Disposition: A | Discharge status: Skilled Nursing Facility | Payer: Self-pay | Source: Ambulatory Visit | Attending: Internal Medicine

## 2011-06-09 ENCOUNTER — Encounter (HOSPITAL_COMMUNITY): Payer: Self-pay | Admitting: *Deleted

## 2011-06-09 DIAGNOSIS — K209 Esophagitis, unspecified without bleeding: Secondary | ICD-10-CM | POA: Clinically undetermined

## 2011-06-09 HISTORY — PX: ESOPHAGOGASTRODUODENOSCOPY: SHX5428

## 2011-06-09 SURGERY — EGD (ESOPHAGOGASTRODUODENOSCOPY)
Anesthesia: Moderate Sedation

## 2011-06-09 MED ORDER — FENTANYL CITRATE 0.05 MG/ML IJ SOLN
INTRAMUSCULAR | Status: AC
  Filled 2011-06-09: qty 2

## 2011-06-09 MED ORDER — ENOXAPARIN SODIUM 40 MG/0.4ML ~~LOC~~ SOLN
40.0000 mg | SUBCUTANEOUS | Status: DC
  Administered 2011-06-09 – 2011-06-10 (×2): 40 mg via SUBCUTANEOUS
  Filled 2011-06-09 (×3): qty 0.4

## 2011-06-09 MED ORDER — MIDAZOLAM HCL 10 MG/2ML IJ SOLN
INTRAMUSCULAR | Status: AC
  Filled 2011-06-09: qty 2

## 2011-06-09 MED ORDER — LEVALBUTEROL HCL 0.63 MG/3ML IN NEBU
0.6300 mg | INHALATION_SOLUTION | RESPIRATORY_TRACT | Status: DC | PRN
  Filled 2011-06-09: qty 3

## 2011-06-09 NOTE — Progress Notes (Signed)
Patient ID: Michelle Hopkins, female   DOB: [birth year], 78 y.o.   MRN: [id number]  Patient Name: Michelle Hopkins      SUBJECTIVE: Anxious for EGD  Past Medical History  Diagnosis Date  . COPD (chronic obstructive pulmonary disease)   . HTN (hypertension)   . Hypothyroidism   . Osteoporosis   . Postherpetic neuralgia     PHYSICAL EXAM Filed Vitals:   06/09/11 0645 06/09/11 0653 06/09/11 0841 06/09/11 0902  BP: 101/70 105/70    Pulse: 90 82 80   Temp: 98.2 F (36.8 C) 98 F (36.7 C)    TempSrc: Oral Oral    Resp: 18 18 16    Height:      Weight:      SpO2: 95% 95%  90%    Cachectic elderly female Good air movement Irregularly irregular rate and rhythm with controlled ventricular response, no murmurs or gallops Abd-soft with active BS   No Clubbing cyanosis edema Skin-warm and dry A & Oriented  Grossly normal sensory and motor function  TELEMETRY: Reviewed telemetry pt in afib with reasonable control of VR    Intake/Output Summary (Last 24 hours) at 06/09/11 1118 Last data filed at 06/09/11 0821  Gross per 24 hour  Intake   1491 ml  Output   1000 ml  Net    491 ml    LABS: Basic Metabolic Panel:  Lab 06/08/11 6644 06/07/11 1725 06/07/11 0530 06/06/11 0530 06/05/11 0550 06/04/11 0550 06/03/11 0605  NA 133* 132* 133* 134* 135 131* 133*  K 3.8 4.2 3.1* 3.8 4.1 4.3 4.9  CL 100 96 100 101 100 91* 95*  CO2 27 24 28 22 27 31  33*  GLUCOSE 96 92 102* 64* 76 89 80  BUN 9 7 9 17 22 23 20   CREATININE 0.46* 0.46* 0.47* 0.49* 0.55 0.67 0.66  CALCIUM 8.1* 7.8* -- -- -- -- --  MG -- -- -- -- -- -- --  PHOS -- -- -- -- -- -- --   Cardiac Enzymes: No results found for this basename: CKTOTAL:3,CKMB:3,CKMBINDEX:3,TROPONINI:3 in the last 72 hours CBC:  Lab 06/06/11 0530 06/05/11 0550 06/04/11 0550 06/03/11 0605  WBC 13.9* 13.5* 13.4* 13.0*  NEUTROABS -- -- -- --  HGB 13.3 13.0 13.9 12.6  HCT 41.5 40.4 42.5 38.7  MCV 86.8 86.5 86.0 86.6  PLT 331 315 368 315    PROTIME:  Basename 06/09/11 0608 06/08/11 0620 06/07/11 0530  LABPROT 15.5* 33.8* 37.4*  INR 1.20 3.27* 3.72*   Liver Function Tests: No results found for this basename: AST:2,ALT:2,ALKPHOS:2,BILITOT:2,PROT:2,ALBUMIN:2 in the last 72 hours No results found for this basename: LIPASE:2,AMYLASE:2 in the last 72 hours BNP: No components found with this basename: POCBNP:5 D-Dimer: No results found for this basename: DDIMER:2 in the last 72 hours Hemoglobin A1C: No results found for this basename: HGBA1C in the last 72 hours Fasting Lipid Panel: No results found for this basename: CHOL,HDL,LDLCALC,TRIG,CHOLHDL,LDLDIRECT in the last 72 hours Thyroid Function Tests: No results found for this basename: TSH,T4TOTAL,FREET3,T3FREE,THYROIDAB in the last 72 hours Anemia Panel: No results found for this basename: VITAMINB12,FOLATE,FERRITIN,TIBC,IRON,RETICCTPCT in the last 72 hours   ASSESSMENT AND PLAN:  Patient Active Hospital Problem List: Pneumonia (05/27/2011)   Esophageal obstruction (06/04/2011)    Atrial fibrillation (05/27/2011) reasonably rate controlled   INR normal now  EGD today then address nutrition.  Apparently patient declines FT        Vira Blanco MD  06/09/2011

## 2011-06-09 NOTE — Progress Notes (Signed)
Subjective: No EGD due to INR up.  Given Vit K, now INR 1.2  Feels ok, less oxygen use this AM.  Reiterated plan.  Objective: Vital signs in last 24 hours: Temp:  [97.4 F (36.3 C)-98.7 F (37.1 C)] 98 F (36.7 C) (01/15 0653) Pulse Rate:  [70-93] 82  (01/15 0653) Resp:  [16-20] 18  (01/15 0653) BP: (90-106)/(58-70) 105/70 mmHg (01/15 0653) SpO2:  [92 %-97 %] 95 % (01/15 0653) FiO2 (%):  [50 %] 50 % (01/14 1645) Weight:  [42.5 kg (93 lb 11.1 oz)] 42.5 kg (93 lb 11.1 oz) (01/15 0500) Weight change:  Last BM Date: 06/03/11  Intake/Output from previous day: 01/14 0701 - 01/15 0700 In: 1491 [I.V.:560; Blood:631; IV Piggyback:300] Out: 800 [Urine:800] Intake/Output this shift:   General appearance: alert, cooperative,  Resp: distant - on nasal cannula, relaxed breathing, slight adventitia on expiration.Chest wall: no tenderness  Cardio: irregularly irregular rhythm  GI: soft, non-tender; bowel sounds normal; no masses, no organomegaly  Extremities: extremities normal, atraumatic, no cyanosis or edema    Lab Results: No results found for this basename: WBC:2,HGB:2,HCT:2,PLT:2 in the last 72 hours BMET  [facility] 06/08/11 0620 06/07/11 1725  NA 133* 132*  K 3.8 4.2  CL 100 96  CO2 27 24  GLUCOSE 96 92  BUN 9 7  CREATININE 0.46* 0.46*  CALCIUM 8.1* 7.8*    Studies/Results: No results found.  Medications:  I have reviewed the patient's current medications. Scheduled:   . antiseptic oral rinse  15 mL Mouth Rinse BID  . azithromycin  500 mg Intravenous Q24H  . cefTRIAXone (ROCEPHIN)  IV  1 g Intravenous Q24H  . Fluticasone-Salmeterol  1 puff Inhalation BID  . levalbuterol  0.63 mg Nebulization QID  . levothyroxine  44 mcg Intravenous QAC breakfast  . lidocaine  1 patch Transdermal Q24H  . mirtazapine  15 mg Oral QHS  . pantoprazole (PROTONIX) IV  40 mg Intravenous Daily  . phytonadione  5 mg Subcutaneous Once  . tiotropium  18 mcg Inhalation Daily   Continuous:    . dextrose 5 % and 0.9% NaCl 50 mL/hr at 06/08/11 1841  . diltiazem (CARDIZEM) infusion 5 mg/hr (06/08/11 1243)   AVW:[id number], morphine injection, ondansetron (ZOFRAN) IV  Assessment/Plan: 1) Worsening Multilobar Pneumonia/Asp PNA. Continue ABX, but really related to aspiration as evidenced by periodicity of worsening. 2) Worsening Hypoxemia - Improved overnight 3) Afib/Flutter. INR 1.2.  I feel that ASA is the best approach long term as if her nutrition remains poor, she runs a considerable risk of overanticoagulationdecision making.  4) Osteoporosis: As above.  5) Dysphagia/Weight loss.Imaging shows an esophagus filled with food. GI Will do EGD later today - it is risky and pt/family understand, and was reiterated this morning as well. Options are very limited however. . Procardia SL made her too dizzy. Considering NG if EGD works.  6) HTN Controlled, Cardizem as well. Will convert to PO post EGD if patent. 7) WBC stable  8) Herpetic neuralgia:Neurontin held due to limited PO Morphine PRN  9) PT/OT incentive spirometer, NPO, keep upright   LOS: 13 days   TISOVEC,RICHARD W 06/09/2011, 8:06 AM

## 2011-06-09 NOTE — Progress Notes (Signed)
ANTICOAGULATION CONSULT NOTE - Follow Up Consult  Pharmacy Consult for Lovenox Indication: atrial fibrillation/DVT px  Allergies  Allergen Reactions  . Avelox (Moxifloxacin Hcl In Nacl)   . Canarium     Unknown reaction  . Codeine   . Latex     Rash   . Neosporin (Neomycin-Polymyxin B)   . Rice   . Tape     Rips skin    Patient Measurements: Height: 5\' 2"  (157.5 cm) Weight: 93 lb 11.1 oz (42.5 kg) IBW/kg (Calculated) : 50.1   Vital Signs: Temp: 98 F (36.7 C) (01/15 0653) Temp src: Oral (01/15 0653) BP: 105/70 mmHg (01/15 0653) Pulse Rate: 80  (01/15 0841)  Labs:  Alvira Philips 06/09/11 0608 06/08/11 0620 06/07/11 1725 06/07/11 0530  HGB -- -- -- --  HCT -- -- -- --  PLT -- -- -- --  APTT -- -- -- --  LABPROT 15.5* 33.8* -- 37.4*  INR 1.20 3.27* -- 3.72*  HEPARINUNFRC -- -- -- --  CREATININE -- 0.46* 0.46* 0.47*  CKTOTAL -- -- -- --  CKMB -- -- -- --  TROPONINI -- -- -- --   Estimated Creatinine Clearance: 36.4 ml/min (by C-G formula based on Cr of 0.46).   Medications:  Scheduled:    . antiseptic oral rinse  15 mL Mouth Rinse BID  . azithromycin  500 mg Intravenous Q24H  . cefTRIAXone (ROCEPHIN)  IV  1 g Intravenous Q24H  . enoxaparin (LOVENOX) injection  40 mg Subcutaneous Q24H  . Fluticasone-Salmeterol  1 puff Inhalation BID  . levothyroxine  44 mcg Intravenous QAC breakfast  . lidocaine  1 patch Transdermal Q24H  . mirtazapine  15 mg Oral QHS  . pantoprazole (PROTONIX) IV  40 mg Intravenous Daily  . phytonadione  5 mg Subcutaneous Once  . tiotropium  18 mcg Inhalation Daily  . DISCONTD: levalbuterol  0.63 mg Nebulization QID    Assessment: 78 y/o female patient with h/o afib/flutter on chronic coumadin which has now been d/c'd. INR subtherapeutic after reversal with sq Vit K. Plan to change to asa for stroke prevention. Active order for Lovenox when INR<2.0. I will give prophylactic dose and sign off as no plans for therapeutic anticoagulation.      Plan:  Lovenox 40mg  sq daily. Will start tonight after EGD.  Verlene Mayer, PharmD, BCPS Pager [phone] 06/09/2011,8:51 AM

## 2011-06-09 NOTE — Progress Notes (Signed)
Clinical Social worker spoke with [facility] insurance representative Clydie Braun [phone]) regarding readiness for discharge.  CSW will continue to monitor patient progress and notify Clydie Braun when patient medically stable for discharge.  Genelle Bal, MSW, LCSW [phone]

## 2011-06-09 NOTE — Progress Notes (Signed)
Called to restart 2 new ivs for pt, due to incompatible meds; pt has been hospitalized now for 13 days per her family; pt has been "stuck" multiple times for labs, new ivs; is currently on a cardizem drip, zithromax, rocephin, and iv fluids, as well as prn iv meds;  Pt is thin, with poor peripheral access; she would benefit from a picc line if she will need continued iv access;  Please advise IV Therapy;  Thank you!

## 2011-06-09 NOTE — Progress Notes (Signed)
PT Cancellation Note  Treatment cancelled today due to out of room for EGD.  Will continue attempts.  WYNN,CYNDI 06/09/2011, 2:23 PM

## 2011-06-10 ENCOUNTER — Encounter (HOSPITAL_COMMUNITY): Payer: Self-pay | Admitting: Internal Medicine

## 2011-06-10 MED ORDER — DILTIAZEM HCL 30 MG PO TABS
30.0000 mg | ORAL_TABLET | Freq: Four times a day (QID) | ORAL | Status: DC
  Administered 2011-06-10 – 2011-06-11 (×5): 30 mg via ORAL
  Filled 2011-06-10 (×9): qty 1

## 2011-06-10 MED ORDER — PANTOPRAZOLE SODIUM 40 MG PO TBEC
40.0000 mg | DELAYED_RELEASE_TABLET | Freq: Every day | ORAL | Status: DC
  Administered 2011-06-10 – 2011-06-11 (×2): 40 mg via ORAL
  Filled 2011-06-10 (×2): qty 1

## 2011-06-10 MED ORDER — BOOST / RESOURCE BREEZE PO LIQD
1.0000 | Freq: Three times a day (TID) | ORAL | Status: DC
  Administered 2011-06-10 – 2011-06-11 (×3): 1 via ORAL

## 2011-06-10 MED ORDER — FLUCONAZOLE 200 MG PO TABS
200.0000 mg | ORAL_TABLET | Freq: Once | ORAL | Status: AC
  Administered 2011-06-10: 200 mg via ORAL
  Filled 2011-06-10: qty 1

## 2011-06-10 MED ORDER — FLUCONAZOLE 100 MG PO TABS
100.0000 mg | ORAL_TABLET | Freq: Every day | ORAL | Status: DC
  Administered 2011-06-11: 100 mg via ORAL
  Filled 2011-06-10: qty 1

## 2011-06-10 MED ORDER — AMOXICILLIN-POT CLAVULANATE 250-62.5 MG/5ML PO SUSR
750.0000 mg | Freq: Two times a day (BID) | ORAL | Status: DC
  Administered 2011-06-10 – 2011-06-11 (×3): 750 mg via ORAL
  Filled 2011-06-10 (×5): qty 15

## 2011-06-10 MED ORDER — LEVOTHYROXINE SODIUM 88 MCG PO TABS
88.0000 ug | ORAL_TABLET | Freq: Every day | ORAL | Status: DC
  Administered 2011-06-10 – 2011-06-11 (×2): 88 ug via ORAL
  Filled 2011-06-10 (×3): qty 1

## 2011-06-10 NOTE — Progress Notes (Signed)
Subjective: Tolerating clears well.  Meds converted to oral, smallest pill sizes available.  No new issues since EGD.  Objective: Vital signs in last 24 hours: Temp:  [97.8 F (36.6 C)-99.2 F (37.3 C)] 97.8 F (36.6 C) (01/16 0500) Pulse Rate:  [80-85] 85  (01/16 0500) Resp:  [16-49] 21  (01/16 0500) BP: (95-127)/(59-85) 127/78 mmHg (01/16 0500) SpO2:  [88 %-98 %] 92 % (01/16 0500) Weight:  [42.729 kg (94 lb 3.2 oz)] 42.729 kg (94 lb 3.2 oz) (01/16 0500) Weight change: 0.229 kg (8.1 oz) Last BM Date: 06/03/11  Intake/Output from previous day: 01/15 0701 - 01/16 0700 In: 1969 [I.V.:1969] Out: 400 [Urine:400] Intake/Output this shift:   General appearance: alert, cooperative,  Resp: clearer - on nasal cannula, relaxed breathing, slight adventitia on expiration. Chest wall: no tenderness  Cardio: irregularly irregular rhythm  GI: soft, non-tender; bowel sounds normal; no masses, no organomegaly  Extremities: extremities normal, atraumatic, no cyanosis or edema      Lab Results: No results found for this basename: WBC:2,HGB:2,HCT:2,PLT:2 in the last 72 hours BMET  [facility] 06/08/11 0620 06/07/11 1725  NA 133* 132*  K 3.8 4.2  CL 100 96  CO2 27 24  GLUCOSE 96 92  BUN 9 7  CREATININE 0.46* 0.46*  CALCIUM 8.1* 7.8*    Studies/Results: No results found.  Medications:  I have reviewed the patient's current medications. Scheduled:   . amoxicillin-clavulanate  750 mg Oral Q12H  . antiseptic oral rinse  15 mL Mouth Rinse BID  . diltiazem  30 mg Oral Q6H  . enoxaparin (LOVENOX) injection  40 mg Subcutaneous Q24H  . Fluticasone-Salmeterol  1 puff Inhalation BID  . levothyroxine  88 mcg Oral QAC breakfast  . lidocaine  1 patch Transdermal Q24H  . mirtazapine  15 mg Oral QHS  . pantoprazole  40 mg Oral Q1200  . tiotropium  18 mcg Inhalation Daily  . DISCONTD: azithromycin  500 mg Intravenous Q24H  . DISCONTD: cefTRIAXone (ROCEPHIN)  IV  1 g Intravenous Q24H  .  DISCONTD: levalbuterol  0.63 mg Nebulization QID  . DISCONTD: levothyroxine  44 mcg Intravenous QAC breakfast  . DISCONTD: pantoprazole (PROTONIX) IV  40 mg Intravenous Daily   Continuous:   . dextrose 5 % and 0.9% NaCl 50 mL/hr at 06/10/11 0700  . diltiazem (CARDIZEM) infusion 5 mg/hr (06/09/11 2002)   GNF:[id number], LORazepam, morphine injection, ondansetron (ZOFRAN) IV  Assessment/Plan: 1) Aspiration PNA, converted to Augmenti, will use liquid, give x 1 more week. 2) Worsening Hypoxemia -Stable on 2L [place] 3) Afib/Flutter. INR 1.2. I feel that ASA is the best approach long term as if her nutrition remains poor, she runs a considerable risk of overanticoagulationdecision making.  4) Osteoporosis: As above.  5) Dysphagia/Weight loss.EGD c/w megaesophagus.  Converted all pills to smallest size, liquids otherwise.  WIll have nutrition see, ? Liquid protein supplementation toleration.  6) HTN Controlled,Cardizem now oral 30mg  q6h.  May need increase, but I would like to keep pills small.  No SR/CD due to that. 7) PT/OT incentive spirometer Dispo--FL-2 signed, has been in contact with [facility], will arrange for discharge there tomorrow if bed available.  , LOS: 14 days   Laytoya Ion W 06/10/2011, 8:04 AM

## 2011-06-10 NOTE — Op Note (Signed)
[facility] [address] [place], [place]  [address]  ENDOSCOPY PROCEDURE REPORT  PATIENT:  Michelle Hopkins, Michelle Hopkins  MR#:  [id number] BIRTHDATE:  [birth year], 84 yrs. old  GENDER:  female  ENDOSCOPIST:  Iva Boop, MD, [facility]  PROCEDURE DATE:  06/09/2011 PROCEDURE:  Esophagoscopy with biopsy ASA CLASS:  Class IV INDICATIONS:  dysphagia, esophageal stasis and food impaction with aspiration pneumonia  MEDICATIONS:   none TOPICAL ANESTHETIC:  none  DESCRIPTION OF PROCEDURE:   After the risks benefits and alternatives of the procedure were thoroughly explained, informed consent was obtained.  The pediatric endoscope was introduced through the mouth and advanced to the stomach body, without limitations.  The instrument was slowly withdrawn as the mucosa was fully examined. <<PROCEDUREIMAGES>>  Esophagitis was found in the total esophagus. White exudate adherent troughout - suggestive of Candida but could be stasis and non-specific exudate. Mucosa below somewhat erythematous but otherwise ok. Multiple biopsies were obtained and sent to pathology.  Otherwise the examination was normal. The gastroesophageal junction was patent and no stenosis seen. Proximal stomach normal.    Retroflexed views revealed no abnormalities.    The scope was then withdrawn from the patient and the procedure completed.  COMPLICATIONS:  None  ENDOSCOPIC IMPRESSION: 1) Esophagitis in the total esophagus 2) Otherwise normal examination RECOMMENDATIONS: 1) Await biopsy results - would treat Candida if present 2) allow liquids - suggest speech path evaluation if not done though think she probably has severe esophageal dysmotility problem  Iva Boop, MD, Clement[facility] Graham  CC:  Creola Corn, MD  n. Rosalie Doctor:   Iva Boop at 06/09/2011 02:53 PM  Ernestene Kiel, [phone]

## 2011-06-10 NOTE — Progress Notes (Signed)
Quick Note:  Hospital patient Treatment started at hospital ______

## 2011-06-10 NOTE — Progress Notes (Signed)
Nutrition Follow-up  S/p esophagram 1/9 -- impression: near complete obstruction of distal esophagus with large amount of food material. EGD completed 1/15. Noted not interested in PEG tube placement per NP note.  Diet Order:  Clear Liquids. No % meal intake recorded.  Meds: Scheduled Meds:   . amoxicillin-clavulanate  750 mg Oral Q12H  . antiseptic oral rinse  15 mL Mouth Rinse BID  . diltiazem  30 mg Oral Q6H  . enoxaparin (LOVENOX) injection  40 mg Subcutaneous Q24H  . Fluticasone-Salmeterol  1 puff Inhalation BID  . levothyroxine  88 mcg Oral QAC breakfast  . lidocaine  1 patch Transdermal Q24H  . mirtazapine  15 mg Oral QHS  . pantoprazole  40 mg Oral Q1200  . tiotropium  18 mcg Inhalation Daily  . DISCONTD: azithromycin  500 mg Intravenous Q24H  . DISCONTD: cefTRIAXone (ROCEPHIN)  IV  1 g Intravenous Q24H  . DISCONTD: levothyroxine  44 mcg Intravenous QAC breakfast  . DISCONTD: pantoprazole (PROTONIX) IV  40 mg Intravenous Daily   Continuous Infusions:   . dextrose 5 % and 0.9% NaCl 50 mL/hr at 06/10/11 0700  . diltiazem (CARDIZEM) infusion Stopped (06/10/11 0915)   PRN Meds:.levalbuterol, LORazepam, morphine injection, ondansetron (ZOFRAN) IV  Labs:  CMP     Component Value Date/Time   NA 133* 06/08/2011 0620   K 3.8 06/08/2011 0620   CL 100 06/08/2011 0620   CO2 27 06/08/2011 0620   GLUCOSE 96 06/08/2011 0620   BUN 9 06/08/2011 0620   CREATININE 0.46* 06/08/2011 0620   CALCIUM 8.1* 06/08/2011 0620   PROT 5.4* 06/06/2011 0530   ALBUMIN 2.5* 06/06/2011 0530   AST 22 06/06/2011 0530   ALT 31 06/06/2011 0530   ALKPHOS 114 06/06/2011 0530   BILITOT 0.2* 06/06/2011 0530   GFRNONAA >90 06/08/2011 0620   GFRAA >90 06/08/2011 0620     Intake/Output Summary (Last 24 hours) at 06/10/11 1246 Last data filed at 06/10/11 0950  Gross per 24 hour  Intake   2069 ml  Output    450 ml  Net   1619 ml    CBG (last 3)   Basename 06/09/11 2102  GLUCAP 97    Weight Status:  42.7 kg  (1/16) -- stable  Nutrition Dx:  Inadequate Oral Intake, ongoing  Goal:  Meet >90% of estimated nutrition needs with PO diet and added supplements, currently unmet Monitor: PO intake, labs, weight, I/O's  Intervention:    Add Nurse, adult supplement PO TID (250 kcals, 9 gm protein per 8 fl oz carton)  RD to follow for nutrition care plan  Alger Memos Pager #:  [phone]

## 2011-06-10 NOTE — Progress Notes (Signed)
Patient discussed at the Long Length of Stay [place] Winward Weeks 06/10/2011  

## 2011-06-10 NOTE — Progress Notes (Signed)
  Patient Name: Michelle Hopkins      SUBJECTIVE: Events surmised from progress notes although I cannot find a note in the chart describing the procedure.  Past Medical History  Diagnosis Date  . COPD (chronic obstructive pulmonary disease)   . HTN (hypertension)   . Hypothyroidism   . Osteoporosis   . Postherpetic neuralgia     PHYSICAL EXAM Filed Vitals:   06/09/11 1500 06/09/11 2100 06/10/11 0500 06/10/11 0806  BP: 123/75 103/65 127/78 122/72  Pulse:  84 85 92  Temp:  98.2 F (36.8 C) 97.8 F (36.6 C)   TempSrc:  Oral Oral   Resp: 23 20 21    Height:      Weight:   94 lb 3.2 oz (42.729 kg)   SpO2: 88% 91% 92% 92%    Well developed and nourished in no acute distress  HENT normal Neck supple with JVP-flat Carotids brisk and full without bruits Clear Irregularly irregular rate and rhythm with controlled/ ventricular response, no murmurs or gallops Abd-soft with active BS without hepatomegaly No Clubbing cyanosis edema Skin-warm and dry A & Oriented  Grossly normal sensory and motor function   TELEMETRY: Reviewed telemetry pt in aF    Intake/Output Summary (Last 24 hours) at 06/10/11 0829 Last data filed at 06/10/11 0700  Gross per 24 hour  Intake   1969 ml  Output    200 ml  Net   1769 ml    LABS: Basic Metabolic Panel:  Lab 06/08/11 2130 06/07/11 1725 06/07/11 0530 06/06/11 0530 06/05/11 0550 06/04/11 0550  NA 133* 132* 133* 134* 135 131*  K 3.8 4.2 3.1* 3.8 4.1 4.3  CL 100 96 100 101 100 91*  CO2 27 24 28 22 27 31   GLUCOSE 96 92 102* 64* 76 89  BUN 9 7 9 17 22 23   CREATININE 0.46* 0.46* 0.47* 0.49* 0.55 0.67  CALCIUM 8.1* 7.8* -- -- -- --  MG -- -- -- -- -- --  PHOS -- -- -- -- -- --   Cardiac Enzymes: No results found for this basename: CKTOTAL:3,CKMB:3,CKMBINDEX:3,TROPONINI:3 in the last 72 hours CBC:  Lab 06/06/11 0530 06/05/11 0550 06/04/11 0550  WBC 13.9* 13.5* 13.4*  NEUTROABS -- -- --  HGB 13.3 13.0 13.9  HCT 41.5 40.4 42.5  MCV  86.8 86.5 86.0  PLT 331 315 368   PROTIME:  Basename 06/09/11 0608 06/08/11 0620  LABPROT 15.5* 33.8*  INR 1.20 3.27*   Liver Function Tests:    ASSESSMENT AND PLAN: The patient has atrial fibrillation that is persistent and under reasonable rate control.  The other noncardiac issue I not sure has never been fully addressed is whether she was aspirating. I wonder whether the barium studies got distracted by the amount of stuffing herself esophagus. With her initial presentation it seems important to exclude this. She remains quite cachectic as well as tachypnea   Signed, Sherryl Manges MD  06/10/2011

## 2011-06-10 NOTE — Progress Notes (Addendum)
[facility] Progress Note  SUBJECTIVE: Feels okay, no complaints tolerating clears without coughing - Michelle Boop, MD, [facility]    OBJECTIVE:  Vital signs in last 24 hours: Temp:  [97.8 F (36.6 C)-99.2 F (37.3 C)] 97.8 F (36.6 C) (01/16 0500) Pulse Rate:  [84-92] 92  (01/16 0806) Resp:  [20-49] 21  (01/16 0500) BP: (95-127)/(59-85) 122/72 mmHg (01/16 0806) SpO2:  [88 %-98 %] 91 % (01/16 0842) Weight:  [42.729 kg (94 lb 3.2 oz)] 42.729 kg (94 lb 3.2 oz) (01/16 0500) Last BM Date: 06/03/11 General:   Frail white female in NAD Heart:  irregular rhythm. Lungs: Respirations even and unlabored, diminished breath sounds throughout. Abdomen:  Soft, nontender and nondistended. Normal bowel sounds. Extremities:  Without edema. Neurologic:  Alert and oriented,  grossly normal neurologically. Psych:  Cooperative. Normal mood and affect.  PT/INR  Basename 06/09/11 0608 06/08/11 0620  LABPROT 15.5* 33.8*  INR 1.20 3.27*   ASSESSMENT / PLAN:  1. Esophageal dysmotility,severe, with recent food impaction. No food in esophagus on EGD yesterday. She did have have esophagitis, biopsies pending, rule out candida. She tolerates clears. If biopsies positive for candida will treat appropriately. See #4. Continue daily PPI.  2.  Afib, rate controlled. Coumadin was stopped. INR reversed and now down to 1.2. 3.  PNA, aspiration. On Augmentin 4.  Malnutrition, apparently not interested in PEG tube. It may be challenging to build and sustain adequate nutritional status with      liquids only. She will need supplements such as Ensure, Boost, etc... 5.  DNR   LOS: 14 days   Willette Cluster  06/10/2011, 9:39 AM   Attending: Patient seen and interviewed also - she is better PLEASE NOTE THAT ENDOSCOPY REPORT HAS NOT TRANSFERRED TO EPIC BUT SHOWED EXUDATE IN ESOPHAGUS WITHOUT OBSTRUCTION - SUSPECT SEVERE DYSMOTILITY (NOT ACHALASIA)  Will advance diet to full liquids. Waiting on  pathology.  She should avoid raw vegetables and really stay on a diet consistency not more than chopped foods at most. Will notify re: pathology when it comes, if anything different needed. Signing off - call if ?'s  Michelle Boop, MD, [facility] [phone] (pager) 06/10/2011 3:08 PM     Biopsies returned showing Candida esophagitis - I started fluconazole - would treat x 3 weeks  Michelle Boop, MD, [facility] [phone] (pager) 06/10/2011 3:26 PM

## 2011-06-11 ENCOUNTER — Encounter: Payer: Self-pay | Admitting: Internal Medicine

## 2011-06-11 MED ORDER — AMOXICILLIN-POT CLAVULANATE 250-62.5 MG/5ML PO SUSR: 750.0000 mg | Freq: Two times a day (BID) | ORAL | Status: AC

## 2011-06-11 MED ORDER — BIOTENE DRY MOUTH MT LIQD: 15.0000 mL | Freq: Two times a day (BID) | OROMUCOSAL | Status: DC

## 2011-06-11 MED ORDER — LEVALBUTEROL HCL 0.63 MG/3ML IN NEBU: 0.6300 mg | INHALATION_SOLUTION | RESPIRATORY_TRACT | Status: AC | PRN

## 2011-06-11 MED ORDER — FLUCONAZOLE 10 MG/ML PO SUSR: 200.0000 mg | Freq: Every day | ORAL | Status: AC

## 2011-06-11 MED ORDER — BOOST / RESOURCE BREEZE PO LIQD: 1.0000 | Freq: Three times a day (TID) | ORAL | Status: DC

## 2011-06-11 MED ORDER — DILTIAZEM HCL 30 MG PO TABS: 30.0000 mg | ORAL_TABLET | Freq: Four times a day (QID) | ORAL | Status: DC

## 2011-06-11 NOTE — Progress Notes (Signed)
Speech Pathology: Dysphagia Treatment Note  Patient was observed with : Mechanical Soft / Ground and Thin liquids.  Patient was noted to have s/s of aspiration : No  Lung Sounds:  diminished Temperature: afebrile  Clinical Impression: Reviewed EGD results and met with patient and her daughter and daughter-in-law.  Discussed 2 esophageal clearance strategies to reduce dysphagic symptoms including use of hot/warm liquids with PO intake to promote LES relaxation as well as alternate liquids/solids at meals to "wash" food through.  Patient aware that she may still experience symptoms of esophageal dysphagia and that these strategies are meant to reduce symptoms, not eliminate.  Provided demonstration and handouts to patient and family. All skilled SLP goals met.  Patient to D/C to SNF today.  Recommendations:  1. Alternate liquids & solids 2. Use hot/warm liquids with PO to promote LES relaxation. 3. D/C skilled acute SLP services  Pain:   none Intervention Required:   No  Goals: All Goals Met  Myra Rude, M.S.,CCC-SLP Pager [phone]

## 2011-06-11 NOTE — Progress Notes (Signed)
Pt discharged to [facility] per MD order. Pt alert and oriented with no complaints of shortness of breath.  Pt and family (son) received all discharge instructions and medication/follow up information. Report called to RN at [facility]. Pt transported via EMS. Efraim Kaufmann

## 2011-06-11 NOTE — Discharge Summary (Signed)
NAMEHAILY, Michelle NO.:  [id number]  MEDICAL RECORD NO.:  [id number]  LOCATION:  3738                         FACILITY:  [facility]  PHYSICIAN:  Gaspar Garbe, M.D.DATE OF BIRTH:  08-30-1926  DATE OF ADMISSION:  05/27/2011 DATE OF DISCHARGE:  06/11/2011                              DISCHARGE SUMMARY   MEDICATIONS ON DISCHARGE: 1. Augmentin 250/62.5 suspension 15 mL b.i.d. x6 days. 2. Biotin liquid rinse twice daily. 3. Diltiazem 30 mg p.o. q.6 hours. 4. Resource Breeze 1 container 3 times daily with meals. 5. Xopenex nebs q.4 hours as needed for wheezing or shortness of     breath. 6. Beta-carotene/Ocuvite 1 tablet by mouth daily. 7. Advair 1 inhalation twice daily, 250/50. 8. Levothyroxine 88 mcg daily. 9. Remeron 15 mg p.o. at bedtime. 10.Omeprazole 20 mg by mouth daily, open or crush. 11.Spiriva 1 inhalation 18 mcg once daily. 12.Diflucan 200 mg p.o. daily x2 weeks.  FINAL DIAGNOSES: 1. Aspiration pneumonia. 2. Megaesophagus with poor motility. 3. Need for chronic liquid feeding. 4. Chronic debilitation. 5. Atrial fibrillation, no longer a Coumadin candidate because of GI     issues and inconsistent feeding. 6. Atrial fibrillation with rapid ventricular rate, resolved. 7. Postherpetic neuralgia, stable. 8. Depression. 9. Insomnia. 10.Osteoporosis. 11.Hypertension. 12.History of dysphagia and weight loss secondary to above.  Physical therapy is to work with her at least 2-3 times a week.  The patient is to be on clear liquids, may progress to full liquids as tolerated, with protein supplements as noted above.  PHYSICAL EXAMINATION:  VITAL SIGNS:  On date of discharge, temperature 97.9, pulse 87, respiratory rate 16, blood pressure 106/73, satting 94%. GENERAL:  No acute distress. HEENT:  Normocephalic, atraumatic.  PERRLA.  EOMI.  ENT is within normal limits.  NECK:  Supple.  No lymphadenopathy, JVD or bruit. HEART:  Irregular,  controlled. LUNGS:  Slight coarse upper sounds but considerably improved over past week.  ABDOMEN:  Soft, nontender, normoactive bowel sounds. EXTREMITIES:  No clubbing, cyanosis, or edema. NEURO:  The patient is oriented to person, place, and time.  Slightly hard of hearing but very pleasant and does not have any lateralizing signs.  LABORATORY TESTS:  Sodium slightly decreased at 133, but potassium at 3.8, the BUN and creatinine of 9 and 0.4, which are her baseline.  Her BNP was initially 2256 and is down and a more normal for her at age 78. Her INR is 1.2.  She had been anticoagulated for a brief period of time in the hospital.  Complete glucose is 97.  She had influenza PCR which was not detected, Legionella which was not detected, strep pneumo which was not detected.  Chest x-ray most recently has shown improvement of aeration.  The patient had biopsy showing acute candidal esophagitis. The patient underwent EGD on June 09, 2011, per Dr. Leone Payor showing the above results with candidal esophagitis as well as megaesophagus.  OTHER CONSULT:  The patient was seen by [facility] for fibrillation and was seen briefly by [facility] for worsening of breathing.  SUMMARY OF HOSPITAL COURSE:  Ms. Shadix was admitted through the emergency room with shortness of breath and what appeared to be a  pneumonia.  She had considerable resolution over the course of the next 5 days, but then had another event earlier in the week with desaturations.  She underwent swallowing study which she passed but was showing that the food was not progressing.  She then underwent barium swallow, which showed considerable amount of food remaining in her esophagus, this was there over the course of the weekend and the patient had her anticoagulation reversed as she had been found to be in atrial fibrillation initially poorly rate controlled but able to be controlled well with low doses of Cardizem  at this time.  This was given initially IV and converted to IV again when she was unable to swallow all of her medications, now have been converted to p.o.  Since her EGD she is doing quite well and is able to tolerate clears and full liquids.  The patient is going to be at a nursing facility for rehabilitation.  After discharge, she is to be seen by Dr. Wylene Simmer in 1-2 weeks.  She may need follow up with Dr. Leone Payor in the meantime and within 2 weeks of her stay at nursing facility of choice.  Next, the patient remains a DNR and yellow forms have been signed with the patient to the facility.     Gaspar Garbe, M.D.     RWT/MEDQ  D:  06/11/2011  T:  06/11/2011  Job:  [id number]

## 2011-06-11 NOTE — Progress Notes (Signed)
Ms. Buttacavoli discharged to [facility] skilled nursing facility today for short-term rehab. Discharge paperwork forwarded to facility. CSW facilitated transport to facility via ambulance. Patient's son at bedside.  Genelle Bal, MSW, LCSW [phone]

## 2011-06-11 NOTE — Discharge Summary (Signed)
[id number] dictated RT

## 2011-06-25 ENCOUNTER — Encounter: Payer: Self-pay | Admitting: Internal Medicine

## 2011-06-25 ENCOUNTER — Ambulatory Visit (INDEPENDENT_AMBULATORY_CARE_PROVIDER_SITE_OTHER): Payer: Medicare Other | Admitting: Internal Medicine

## 2011-06-25 VITALS — BP 90/60 | HR 60 | Ht 62.0 in | Wt 81.8 lb

## 2011-06-25 DIAGNOSIS — K224 Dyskinesia of esophagus: Secondary | ICD-10-CM

## 2011-06-25 DIAGNOSIS — R634 Abnormal weight loss: Secondary | ICD-10-CM

## 2011-06-25 NOTE — Patient Instructions (Signed)
Advance diet to soft. Alternate liquids and solids. Stay upright when eating and drinking and for 1 hour after. Follow up to see Dr. Leone Payor as needed.

## 2011-06-25 NOTE — Assessment & Plan Note (Signed)
I think there was some confusion, she does not have to remain on a liquid diet. She is to alternate liquids and solids so we'll advance to a soft diet and she will alternate drinking after eating solids. Will remain upright. I have sent a copy of the speech pathology note to the nursing home as well.

## 2011-06-25 NOTE — Progress Notes (Signed)
Subjective:    Patient ID: Michelle Hopkins, female    DOB: 1926/12/07, 78 y.o.   MRN: [id number]  HPI Is elderly white woman was seen and evaluated in the hospital. She had what appeared to be a severe food impaction. We discovered that she had candida esophagitis and esophageal dysmotility. She was treated with fluconazole. She was discharged to a nursing home. They have not advanced her diet be on liquids at this point, being only on clear liquids. She says she has lost about 10 pounds. At the time of discharge the instructions for that she was supposed to alternate eating solids and liquids, i.e. flush her solid food with liquid. Allergies  Allergen Reactions  . Avelox (Moxifloxacin Hcl In Nacl)   . Canarium     Unknown reaction  . Codeine   . Latex     Rash   . Neosporin (Neomycin-Polymyxin B)   . Rice   . Tape     Rips skin   Outpatient Prescriptions Prior to Visit  Medication Sig Dispense Refill  . antiseptic oral rinse (BIOTENE) LIQD 15 mLs by Mouth Rinse route 2 (two) times daily.  300 mL  0  . beta carotene w/minerals (OCUVITE) tablet Take 1 tablet by mouth daily.        Marland Kitchen diltiazem (CARDIZEM) 30 MG tablet Take 1 tablet (30 mg total) by mouth every 6 (six) hours.      . feeding supplement (RESOURCE BREEZE) LIQD Take 1 Container by mouth 3 (three) times daily with meals.      . fluconazole (DIFLUCAN) 10 MG/ML suspension Take 20 mLs (200 mg total) by mouth daily.  35 mL  0  . Fluticasone-Salmeterol (ADVAIR) 250-50 MCG/DOSE AEPB Inhale 1 puff into the lungs daily.        Marland Kitchen levalbuterol (XOPENEX) 0.63 MG/3ML nebulizer solution Take 3 mLs (0.63 mg total) by nebulization every 4 (four) hours as needed for wheezing or shortness of breath.  3 mL  0  . levothyroxine (SYNTHROID, LEVOTHROID) 88 MCG tablet Take 88 mcg by mouth daily.        . mirtazapine (REMERON) 15 MG tablet Take 15 mg by mouth at bedtime.        Marland Kitchen omeprazole (PRILOSEC) 20 MG capsule Take 20 mg by mouth daily.        Marland Kitchen  tiotropium (SPIRIVA) 18 MCG inhalation capsule Place 18 mcg into inhaler and inhale daily.         Past Medical History  Diagnosis Date  . COPD (chronic obstructive pulmonary disease)   . HTN (hypertension)   . Hypothyroidism   . Osteoporosis   . Postherpetic neuralgia   . Aspiration pneumonia Jan. 2013  . Candida esophagitis Jan. 2013  . Lupus   . PAF (paroxysmal atrial fibrillation)   . Vitamin d deficiency   . Acute on chronic diastolic heart failure   . Esophageal dysmotility    Past Surgical History  Procedure Date  . Appendectomy 1996  . Left hip repair s/p fracture 2004  . Esophagogastroduodenoscopy 06/09/2011    Procedure: ESOPHAGOGASTRODUODENOSCOPY (EGD);  Surgeon: Iva Boop, MD;  Location: [facility] ENDOSCOPY;  Service: Endoscopy;  Laterality: N/A;   History   Social History  . Marital Status: Widowed    Spouse Name: N/A    Number of Children: 3  . Years of Education: N/A   Occupational History  . retired    Social History Main Topics  . Smoking status: Former Smoker -- 1.0 packs/day for  20 years    Types: Cigarettes  . Smokeless tobacco: Never Used  . Alcohol Use: No  . Drug Use: No  . Sexually Active: None   Other Topics Concern  . None   Social History Narrative   Lives in [place], [place] alone. Widowed. 1 daughter.    Family History  Problem Relation Age of Onset  . Colon cancer Father   . Huntington's disease Mother   . Coronary artery disease Brother   . Melanoma Sister         Review of Systems Chronic dyspnea. Very weak overall.    Objective:   Physical Exam Chronically ill, in wheelchair, on nasal cannula oxygen Dentures intact.       Assessment & Plan:

## 2011-06-25 NOTE — Assessment & Plan Note (Signed)
Related to diet I think plus her comorbidities. Will advance her diet.

## 2011-08-19 ENCOUNTER — Encounter: Payer: Self-pay | Admitting: Physician Assistant

## 2011-08-24 ENCOUNTER — Encounter: Payer: Self-pay | Admitting: *Deleted

## 2011-08-25 ENCOUNTER — Ambulatory Visit (INDEPENDENT_AMBULATORY_CARE_PROVIDER_SITE_OTHER): Payer: Medicare Other | Admitting: Physician Assistant

## 2011-08-25 ENCOUNTER — Encounter: Payer: Self-pay | Admitting: Physician Assistant

## 2011-08-25 VITALS — BP 115/80 | HR 85 | Ht 61.0 in | Wt 88.4 lb

## 2011-08-25 DIAGNOSIS — I4891 Unspecified atrial fibrillation: Secondary | ICD-10-CM

## 2011-08-25 MED ORDER — DILTIAZEM HCL 30 MG PO TABS: 30.0000 mg | ORAL_TABLET | Freq: Four times a day (QID) | ORAL | Status: DC

## 2011-08-25 NOTE — Progress Notes (Signed)
[address] [place], [place]  [address] Phone: [phone] Fax:  [phone]  Date:  08/25/2011   Name:  Michelle Hopkins       DOB:  15-Aug-1926 MRN:  [id number]  PCP:  Dr. Wylene Simmer Primary Cardiologist:  Dr. Sherryl Manges  Primary Electrophysiologist:  Dr. Sherryl Manges    History of Present Illness: Michelle Hopkins is a 78 y.o. female who returns for post hospital follow up.  She was admitted in 05/2011 with AFib with RVR in the setting of bilateral aspiration pneumonia. She was rate controlled and put on coumadin.  She required Lasix for volume overload in the setting of RVR.  Her EF was normal by echo.  She was noted to have esophageal obstruction.  EGD demonstrated candida esophagitis.  This was tx.  She was d/c to rehab.  She is now back home.  She has HHPT/HHOT.  She is on chronic O2.  She sees pulmonary soon.  Since d/c, she is stable.  She is eating shredded foods.  She notes chronic DOE, and is likely class 3.  No orthopnea, PND or edema.  No syncope.  No chest pain.     2D Echo 05/2011: Study Conclusions  - Left ventricle: The cavity size was normal. Wall thickness was normal. Systolic function was normal. The estimated ejection fraction was in the range of 55% to 60%. Wall motion was normal; there were no regional wall motion abnormalities. The study is not technically sufficient to allow evaluation of LV diastolic function. - Mitral valve: Moderate regurgitation. - Left atrium: The atrium was moderately dilated. - Right ventricle: The cavity size was mildly dilated. - Right atrium: The atrium was moderately dilated. - Tricuspid valve: Severe regurgitation. - Pulmonary arteries: Systolic pressure was severely increased. - Pericardium, extracardiac: A trivial pericardial effusion was identified.   Past Medical History  Diagnosis Date  . COPD (chronic obstructive pulmonary disease)   . HTN (hypertension)   . Hypothyroidism   . Osteoporosis     . Postherpetic neuralgia   . Aspiration pneumonia Jan. 2013  . Candida esophagitis Jan. 2013  . Lupus   . PAF (paroxysmal atrial fibrillation)   . Vitamin d deficiency   . Acute on chronic diastolic heart failure   . Esophageal dysmotility     Current Outpatient Prescriptions  Medication Sig Dispense Refill  . beta carotene w/minerals (OCUVITE) tablet Take 1 tablet by mouth daily.        Marland Kitchen diltiazem (CARDIZEM) 30 MG tablet Take 1 tablet (30 mg total) by mouth every 6 (six) hours.      . feeding supplement (RESOURCE BREEZE) LIQD Take 1 Container by mouth 3 (three) times daily with meals.      . Fluticasone-Salmeterol (ADVAIR) 250-50 MCG/DOSE AEPB Inhale 1 puff into the lungs daily.        Marland Kitchen levalbuterol (XOPENEX) 0.63 MG/3ML nebulizer solution Take 3 mLs (0.63 mg total) by nebulization every 4 (four) hours as needed for wheezing or shortness of breath.  3 mL  0  . levothyroxine (SYNTHROID, LEVOTHROID) 88 MCG tablet Take 88 mcg by mouth daily.        . mirtazapine (REMERON) 15 MG tablet Take 15 mg by mouth at bedtime.        Marland Kitchen omeprazole (PRILOSEC) 20 MG capsule Take 20 mg by mouth daily.        Marland Kitchen tiotropium (SPIRIVA) 18 MCG inhalation capsule Place 18 mcg into inhaler and inhale daily.  Allergies: Allergies  Allergen Reactions  . Avelox (Moxifloxacin Hcl In Nacl)   . Canarium     Unknown reaction  . Codeine   . Latex     Rash   . Neosporin (Neomycin-Polymyxin B)   . Rice   . Tape     Rips skin    History  Substance Use Topics  . Smoking status: Former Smoker -- 1.0 packs/day for 20 years    Types: Cigarettes    Quit date: 08/25/1979  . Smokeless tobacco: Never Used  . Alcohol Use: No     ROS:  Please see the history of present illness.   All other systems reviewed and negative.   PHYSICAL EXAM: VS:  BP 115/80  Pulse 85  Ht 5\' 1"  (1.549 m)  Wt 88 lb 6.4 oz (40.098 kg)  BMI 16.70 kg/m2 Cachectic female in no acute distress HEENT: normal Neck: no  JVD Cardiac:  normal S1, S2; irreg irreg; no murmur Lungs:  Decreased breath sounds bilaterally, no wheezing, rhonchi or rales Abd: soft, nontender, no hepatomegaly Ext: no edema Skin: warm and dry Neuro:  CNs 2-12 intact, no focal abnormalities noted  EKG:  Afib, HR 89  ASSESSMENT AND PLAN:  1.  Atrial Fibrillation D/w Dr. Sherryl Manges who also saw the patient.  Risks and benefits discussed with the patient regarding coumadin.  She is at significant stroke risk.  She also seems to have a significant fall risk.  She does not wish to take coumadin.  There is no role for ASA and we have recommended she not take this.  Rate is controlled.  She can follow up with Dr. Sherryl Manges as needed.  2.  Diastolic CHF This was in the setting of RVR and pneumonia.  Volume stable.  No change in Rx.  Follow up with Dr. Sherryl Manges as needed.    3.  Candida Esophagitis This has been treated and her swallowing has improved.  4.  COPD She is on continuous O2.  She sees pulmonary soon.   Signed, Tereso Newcomer, PA-C  3:36 PM 08/25/2011

## 2011-08-25 NOTE — Patient Instructions (Addendum)
Your physician recommends that you schedule a follow-up appointment in: AS NEEDED  A REFILL FOR DILTIAZEM HAS BEEN SENT IN TODAY FOR YOU

## 2011-08-31 ENCOUNTER — Telehealth: Payer: Self-pay | Admitting: Physician Assistant

## 2011-08-31 NOTE — Telephone Encounter (Signed)
Please return call to patient at [phone]   Patient said we have her listed as allergic to Adelox.  Please return call to patient to discuss as she states she can tolerate this medication very well.  She can be reached at [phone]

## 2011-08-31 NOTE — Telephone Encounter (Signed)
Patient called stated she recently had appointment and noticed on her after visit sheet allergic to avelox.Patient stated she wanted that removed.States she is not allergic to avelox.

## 2011-09-02 ENCOUNTER — Ambulatory Visit (INDEPENDENT_AMBULATORY_CARE_PROVIDER_SITE_OTHER): Payer: Medicare Other | Admitting: Internal Medicine

## 2011-09-02 ENCOUNTER — Ambulatory Visit (INDEPENDENT_AMBULATORY_CARE_PROVIDER_SITE_OTHER)
Admission: RE | Admit: 2011-09-02 | Discharge: 2011-09-02 | Disposition: A | Discharge status: Home / Self Care | Payer: Medicare Other | Source: Ambulatory Visit | Attending: Internal Medicine | Admitting: Internal Medicine

## 2011-09-02 ENCOUNTER — Encounter: Payer: Self-pay | Admitting: Internal Medicine

## 2011-09-02 VITALS — BP 130/82 | HR 77 | Temp 98.0°F | Ht 61.0 in | Wt 88.8 lb

## 2011-09-02 DIAGNOSIS — J449 Chronic obstructive pulmonary disease, unspecified: Secondary | ICD-10-CM

## 2011-09-02 NOTE — Progress Notes (Signed)
Quick Note:  Pt aware of results and no questions or concerns. ______ 

## 2011-09-02 NOTE — Assessment & Plan Note (Addendum)
Clinically severe now 02 dep p pna ? Asp mechanism and doubt much more to offer here but ok to rechallenge with spiriva as far as I can see  The proper method of use, as well as anticipated side effects, of a dry powder inhaler are discussed and demonstrated to the patient. Improved effectiveness after extensive coaching during this visit to a level of approximately  90% noted  Will need to return for pft's to complete the w/u.  In terms of other concerns, DDX of  difficult airways managment all start with A and  include Adherence, Ace Inhibitors, Acid Reflux, Active Sinus Disease, Alpha 1 Antitripsin deficiency, Anxiety masquerading as Airways dz,  ABPA,  allergy(esp in young), Aspiration (esp in elderly), Adverse effects of DPI,  Active smokers, plus two Bs  = Bronchiectasis and Beta blocker use..and one C= CHF   Aspiraton continues to be a major concern as is Acid reflux/ GERD

## 2011-09-02 NOTE — Progress Notes (Signed)
  Subjective:    Patient ID: Michelle Hopkins, female    DOB: 1926-07-14  MRN: [id number]  HPI  78 yowf quit smoking age 78 with baseline advair/ spiriva  no need for 02 could do HT but used [facility] parking then hosp at [facility] Jan 2013  And "not right since" with breathing   DATE OF ADMISSION: 05/27/2011  DATE OF DISCHARGE: 06/11/2011  DISCHARGE SUMMARY  MEDICATIONS ON DISCHARGE:  1. Augmentin 250/62.5 suspension 15 mL b.i.d. x6 days.  2. Biotin liquid rinse twice daily.  3. Diltiazem 30 mg p.o. q.6 hours.  4. Resource Breeze 1 container 3 times daily with meals.  5. Xopenex nebs q.4 hours as needed for wheezing or shortness of  breath.  6. Beta-carotene/Ocuvite 1 tablet by mouth daily.  7. Advair 1 inhalation twice daily, 250/50.  8. Levothyroxine 88 mcg daily.  9. Remeron 15 mg p.o. at bedtime.  10.Omeprazole 20 mg by mouth daily, open or crush.  11.Spiriva 1 inhalation 18 mcg once daily.  12.Diflucan 200 mg p.o. daily x2 weeks.  FINAL DIAGNOSES:  1. Aspiration pneumonia.  2. Megaesophagus with poor motility.  3. Need for chronic liquid feeding.  4. Chronic debilitation.  5. Atrial fibrillation, no longer a Coumadin candidate because of GI  issues and inconsistent feeding.  6. Atrial fibrillation with rapid ventricular rate, resolved.  7. Postherpetic neuralgia, stable.  8. Depression.  9. Insomnia.  10.Osteoporosis.  11.Hypertension.  12.History of dysphagia and weight loss secondary to above.   09/02/2011 1st pulmonary eval cc doe x 50 ft on 4lpm , not really bothered by cough. On advair but not spiriva.    Sleeping ok without nocturnal  or early am exacerbation  of respiratory  c/o's or need for noct saba. Also denies any obvious fluctuation of symptoms with weather or environmental changes or other aggravating or alleviating factors except as outlined above    Review of Systems  Constitutional: Positive for unexpected weight change. Negative for fever and chills.  HENT:  Positive for trouble swallowing. Negative for ear pain, nosebleeds, congestion, sore throat, rhinorrhea, sneezing, dental problem, voice change, postnasal drip and sinus pressure.   Eyes: Negative for visual disturbance.  Respiratory: Positive for cough and shortness of breath. Negative for choking.   Cardiovascular: Negative for chest pain and leg swelling.  Gastrointestinal: Negative for vomiting, abdominal pain and diarrhea.  Genitourinary: Negative for difficulty urinating.  Musculoskeletal: Negative for arthralgias.  Skin: Negative for rash.  Neurological: Negative for tremors, syncope and headaches.  Hematological: Does not bruise/bleed easily.       Objective:   Physical Exam 09/02/2011  89  Elderly wf with / movement disorder  HEENT mild turbinate edema.  Oropharynx no thrush or excess pnd or cobblestoning.  No JVD or cervical adenopathy. Mild accessory muscle hypertrophy. Trachea midline, nl thryroid. Chest was hyperinflated by percussion with diminished breath sounds and moderate increased exp time without wheeze. Hoover sign positive at mid inspiration. Regular rate and rhythm without murmur gallop or rub or increase P2 or edema.  Abd: no hsm, nl excursion. Ext warm without cyanosis or clubbing.   CXR  09/02/2011 :  Near complete resolution of bilateral air space opacities and  pleural effusions. No significant residual material identified  within the esophageal lumen.       Assessment & Plan:

## 2011-09-02 NOTE — Patient Instructions (Addendum)
Start spiriva each am  Only use your xopenex  as a rescue medication to be used if you can't catch your breath by resting or doing a relaxed purse lip breathing pattern. The less you use it, the better it will work when you need it. Can be used up to every 4 hours if needed.  Please remember to go to the  x-ray department downstairs for your tests - we will call you with the results when they are available.     Please schedule a follow up office visit in 6 weeks, call sooner if needed with pft's on return

## 2011-09-03 ENCOUNTER — Telehealth: Payer: Self-pay | Admitting: Internal Medicine

## 2011-09-03 DIAGNOSIS — J449 Chronic obstructive pulmonary disease, unspecified: Secondary | ICD-10-CM

## 2011-09-03 NOTE — Telephone Encounter (Signed)
Libby already faxed order to [facility]!! Will forward to her to check and see why [facility] says otherwise thanks!

## 2011-09-03 NOTE — Telephone Encounter (Signed)
Spoke with pt. She is requesting to have her o2 provider switched from [facility] to [facility]. Order was sent to [facility], nothing further needed.

## 2011-09-04 NOTE — Telephone Encounter (Signed)
Spoke to pt she has heard from [facility] and they are coming today to set up her 02 so DRS can remove their's [employer]os

## 2011-09-16 ENCOUNTER — Ambulatory Visit: Payer: Medicare Other | Admitting: Pulmonary Disease

## 2011-10-14 ENCOUNTER — Ambulatory Visit (INDEPENDENT_AMBULATORY_CARE_PROVIDER_SITE_OTHER): Payer: Medicare Other | Admitting: Internal Medicine

## 2011-10-14 ENCOUNTER — Ambulatory Visit: Payer: Medicare Other | Admitting: Internal Medicine

## 2011-10-14 DIAGNOSIS — J449 Chronic obstructive pulmonary disease, unspecified: Secondary | ICD-10-CM

## 2011-10-14 DIAGNOSIS — J4489 Other specified chronic obstructive pulmonary disease: Secondary | ICD-10-CM

## 2011-10-14 LAB — PULMONARY FUNCTION TEST

## 2011-10-14 NOTE — Progress Notes (Signed)
PFT done today. 

## 2011-10-15 ENCOUNTER — Encounter: Payer: Self-pay | Admitting: Internal Medicine

## 2011-10-21 ENCOUNTER — Ambulatory Visit (INDEPENDENT_AMBULATORY_CARE_PROVIDER_SITE_OTHER): Payer: Medicare Other | Admitting: Internal Medicine

## 2011-10-21 ENCOUNTER — Encounter: Payer: Self-pay | Admitting: Internal Medicine

## 2011-10-21 VITALS — BP 90/62 | HR 90 | Temp 98.1°F | Ht 62.0 in | Wt 89.6 lb

## 2011-10-21 DIAGNOSIS — J9610 Chronic respiratory failure, unspecified whether with hypoxia or hypercapnia: Secondary | ICD-10-CM | POA: Insufficient documentation

## 2011-10-21 DIAGNOSIS — J449 Chronic obstructive pulmonary disease, unspecified: Secondary | ICD-10-CM

## 2011-10-21 NOTE — Progress Notes (Signed)
Subjective:    Patient ID: Michelle Hopkins, female    DOB: 1926-12-25  MRN: [id number]  HPI  78 yowf quit smoking age 77 with baseline advair/ spiriva  no need for 02 could do HT but [facility] Jan [facility]  And "not right since" with breathing   DATE OF ADMISSION: 05/27/2011  DATE OF DISCHARGE: 06/11/2011  DISCHARGE SUMMARY  MEDICATIONS ON DISCHARGE:  1. Augmentin 250/62.5 suspension 15 mL b.i.d. x6 days.  2. Biotin liquid rinse twice daily.  3. Diltiazem 30 mg p.o. q.6 hours.  4. Resource Breeze 1 container 3 times daily with meals.  5. Xopenex nebs q.4 hours as needed for wheezing or shortness of  breath.  6. Beta-carotene/Ocuvite 1 tablet by mouth daily.  7. Advair 1 inhalation twice daily, 250/50.  8. Levothyroxine 88 mcg daily.  9. Remeron 15 mg p.o. at bedtime.  10.Omeprazole 20 mg by mouth daily, open or crush.  11.Spiriva 1 inhalation 18 mcg once daily.  12.Diflucan 200 mg p.o. daily x2 weeks.  FINAL DIAGNOSES:  1. Aspiration pneumonia.  2. Megaesophagus with poor motility.  3. Need for chronic liquid feeding.  4. Chronic debilitation.  5. Atrial fibrillation, no longer a Coumadin candidate because of GI  issues and inconsistent feeding.  6. Atrial fibrillation with rapid ventricular rate, resolved.  7. Postherpetic neuralgia, stable.  8. Depression.  9. Insomnia.  10.Osteoporosis.  11.Hypertension.  12.History of dysphagia and weight loss secondary to above.   09/02/2011 1st pulmonary eval cc doe x 50 ft on 4lpm , not really bothered by cough. On advair but not spiriva.   rec Spiriva each am Only use your xopenex  as a rescue medication     10/21/2011 f/u ov/Michelle Hopkins on 4lpm 24 h cc sob better, able to walk 112f s stopping   hasn't needed any saba daytime, no cough, feels spiriva helping and swallowing has improved.   Sleeping ok without nocturnal  or early am exacerbation  of respiratory  c/o's or need for noct saba. Also denies any obvious  fluctuation of symptoms with weather or environmental changes or other aggravating or alleviating factors except as outlined above.  ROS  At present neg for  any significant sore throat, dysphagia, dental problems, itching, sneezing,  nasal congestion or excess/ purulent secretions, ear ache,   fever, chills, sweats, unintended wt loss, pleuritic or exertional cp, hemoptysis, palpitations, orthopnea pnd or leg swelling.  Also denies presyncope, palpitations, heartburn, abdominal pain, anorexia, nausea, vomiting, diarrhea  or change in bowel or urinary habits, change in stools or urine, dysuria,hematuria,  rash, arthralgias, visual complaints, headache, numbness weakness . No noted change in mood/affect or memory.                         Objective:   Physical Exam 09/02/2011  88 > 89 10/21/2011  Elderly wf with / ? choreathetoid movement disorder  HEENT mild turbinate edema.  Oropharynx no thrush or excess pnd or cobblestoning.  No JVD or cervical adenopathy. Mild accessory muscle hypertrophy. Trachea midline, nl thryroid. Chest was hyperinflated by percussion with diminished breath sounds and moderate increased exp time without wheeze. Hoover sign positive at mid inspiration. Regular rate and rhythm without murmur gallop or rub or increase P2 or edema.  Abd: no hsm, nl excursion. Ext warm without cyanosis or clubbing.   CXR  09/02/2011 :  Near complete resolution of bilateral air space opacities and  pleural effusions.  No significant residual material identified  within the esophageal lumen.       Assessment & Plan:

## 2011-10-21 NOTE — Assessment & Plan Note (Signed)
-   Added back spiriva 09/02/2011    - PFT's 10/14/11  FEV1  0.65 (45%) ratio 42 and 12% better p B2, DLC0 30 corrects to 47%  GOLD III 02 dep but symptomatically improved on advair and spiriva and little else to offer at this point x precautions re swallowing issues  See instructions for specific recommendations which were reviewed directly with the patient who was given a copy with highlighter outlining the key components.

## 2011-10-21 NOTE — Patient Instructions (Signed)
Be very careful with swallowing that you don't get things stuck or get them down the wrong way - if swallowing gets any worse may need an alternative to cardizem like bisoprolol.   Please schedule a follow up visit in 3 months but call sooner if needed

## 2011-10-21 NOTE — Assessment & Plan Note (Signed)
-   4lpm NP 24/7 since discharge 05/2011   Adequate control on present rx, reviewed > oximetry data is intermittently spurious but it appears she will need 4lpm 24/7 indefinitely

## 2012-01-21 ENCOUNTER — Encounter: Payer: Self-pay | Admitting: Internal Medicine

## 2012-01-21 ENCOUNTER — Ambulatory Visit (INDEPENDENT_AMBULATORY_CARE_PROVIDER_SITE_OTHER): Payer: Medicare Other | Admitting: Internal Medicine

## 2012-01-21 VITALS — BP 110/80 | HR 50 | Temp 98.2°F | Ht 63.0 in | Wt 91.4 lb

## 2012-01-21 DIAGNOSIS — J449 Chronic obstructive pulmonary disease, unspecified: Secondary | ICD-10-CM

## 2012-01-21 DIAGNOSIS — J9610 Chronic respiratory failure, unspecified whether with hypoxia or hypercapnia: Secondary | ICD-10-CM

## 2012-01-21 MED ORDER — TIOTROPIUM BROMIDE MONOHYDRATE 18 MCG IN CAPS: 18.0000 ug | ORAL_CAPSULE | Freq: Every day | RESPIRATORY_TRACT | Status: DC

## 2012-01-21 NOTE — Patient Instructions (Signed)
No change in medications  Please see patient coordinator before you leave today  to schedule pulmonary rehab   Please schedule a follow up visit in 3 months but call sooner if needed

## 2012-01-21 NOTE — Progress Notes (Signed)
Subjective:    Patient ID: Michelle Hopkins, female    DOB: 1927-03-25  MRN: [id number]  HPI  78 yowf quit smoking age 78 with baseline advair/ spiriva  no need for 02 could do HT but [facility] then [facility] Jan 2013  And "not right since" with breathing   DATE OF ADMISSION: 05/27/2011  DATE OF DISCHARGE: 06/11/2011  DISCHARGE SUMMARY  MEDICATIONS ON DISCHARGE:  1. Augmentin 250/62.5 suspension 15 mL b.i.d. x6 days.  2. Biotin liquid rinse twice daily.  3. Diltiazem 30 mg p.o. q.6 hours.  4. Resource Breeze 1 container 3 times daily with meals.  5. Xopenex nebs q.4 hours as needed for wheezing or shortness of  breath.  6. Beta-carotene/Ocuvite 1 tablet by mouth daily.  7. Advair 1 inhalation twice daily, 250/50.  8. Levothyroxine 88 mcg daily.  9. Remeron 15 mg p.o. at bedtime.  10.Omeprazole 20 mg by mouth daily, open or crush.  11.Spiriva 1 inhalation 18 mcg once daily.  12.Diflucan 200 mg p.o. daily x2 weeks.  FINAL DIAGNOSES:  1. Aspiration pneumonia.  2. Megaesophagus with poor motility.  3. Need for chronic liquid feeding.  4. Chronic debilitation.  5. Atrial fibrillation, no longer a Coumadin candidate because of GI  issues and inconsistent feeding.  6. Atrial fibrillation with rapid ventricular rate, resolved.  7. Postherpetic neuralgia, stable.  8. Depression.  9. Insomnia.  10.Osteoporosis.  11.Hypertension.  12.History of dysphagia and weight loss secondary to above.   09/02/2011 1st pulmonary eval cc doe x 50 ft on 4lpm , not really bothered by cough. On advair but not spiriva.   rec Spiriva each am Only use your xopenex  as a rescue medication     10/21/2011 f/u ov/Michelle Hopkins on 4lpm 24 h cc sob better, able to walk 132f s stopping   hasn't needed any saba daytime, no cough, feels spiriva helping and swallowing has improved. Rec Be very careful with swallowing that you don't get things stuck or get them down the wrong way - if swallowing gets any worse  may need an alternative to cardizem like bisoprolol   01/21/2012 f/u ov/Michelle Hopkins  on 4lpm, walking sleeping sitting> no limiting sob but very sedentary.  No obvious daytime variabilty or assoc chronic cough or cp or chest tightness, subjective wheeze overt sinus or hb symptoms. No unusual exp hx. No daytime saba use, has neb not hfa but not needing either at this point.   Sleeping ok without nocturnal  or early am exacerbation  of respiratory  c/o's or need for noct saba. Also denies any obvious fluctuation of symptoms with weather or environmental changes or other aggravating or alleviating factors except as outlined above.  ROS  At present neg for  any significant sore throat, dysphagia, dental problems, itching, sneezing,  nasal congestion or excess/ purulent secretions, ear ache,   fever, chills, sweats, unintended wt loss, pleuritic or exertional cp, hemoptysis, palpitations, orthopnea pnd or leg swelling.  Also denies presyncope, palpitations, heartburn, abdominal pain, anorexia, nausea, vomiting, diarrhea  or change in bowel or urinary habits, change in stools or urine, dysuria,hematuria,  rash, arthralgias, visual complaints, headache, numbness weakness . No noted change in mood/affect or memory.                         Objective:   Physical Exam 09/02/2011  88 > 89 10/21/2011 > 01/21/2012  89 Elderly wf with / ? choreathetoid movement disorder  HEENT  mild turbinate edema.  Oropharynx no thrush or excess pnd or cobblestoning.  No JVD or cervical adenopathy. Mild accessory muscle hypertrophy. Trachea midline, nl thryroid. Chest was hyperinflated by percussion with diminished breath sounds and moderate increased exp time without wheeze. Hoover sign positive at mid inspiration. Regular rate and rhythm without murmur gallop or rub or increase P2 or edema.  Abd: no hsm, nl excursion. Ext warm without cyanosis or clubbing.   CXR  09/02/2011 :  Near complete resolution of bilateral air space  opacities and  pleural effusions. No significant residual material identified  within the esophageal lumen.       Assessment & Plan:

## 2012-01-21 NOTE — Assessment & Plan Note (Signed)
-   Added back spiriva 09/02/2011    - PFT's 10/14/11  FEV1  0.65 (45%) ratio 42 and 12% better p B2, DLC0 30 corrects to 47%   - refer to pulmonary rehab 01/21/2012   GOLD III / 02 dep but no variability/ cough or AB component > Adequate control on present rx, reviewed   Next step is pulmonary rehab

## 2012-01-23 NOTE — Assessment & Plan Note (Signed)
-   4lpm NP 24/7 since discharge 05/2011    - 01/21/2012   Walked 4lom  xone lap @ 185 stopped due to  Sob but sat still 94% so needs 4 lpm with acitivty, 2lpm ok at rest/sleeping  Adequate control on present rx, reviewed recs  See instructions for specific recommendations which were reviewed directly with the patient who was given a copy with highlighter outlining the key components.

## 2012-01-26 ENCOUNTER — Telehealth: Payer: Self-pay | Admitting: Internal Medicine

## 2012-01-26 DIAGNOSIS — J449 Chronic obstructive pulmonary disease, unspecified: Secondary | ICD-10-CM

## 2012-01-26 NOTE — Telephone Encounter (Signed)
I spoke with pt and is requesting an order to be sent to [facility] for portable oxygen. Per leslie okay to send order for this. Order has been sent

## 2012-03-21 ENCOUNTER — Telehealth: Payer: Self-pay | Admitting: Internal Medicine

## 2012-03-21 DIAGNOSIS — J449 Chronic obstructive pulmonary disease, unspecified: Secondary | ICD-10-CM

## 2012-03-21 NOTE — Telephone Encounter (Signed)
Spoke with patient in regards to message. Patient states that she wants a portable O2 tank [facility] 2L/M. Is it okay to send order for portable tank to [facility]?  Dr Sherene Sires please advise. Thanks.

## 2012-03-21 NOTE — Telephone Encounter (Signed)
Should already by on 4lpm with activity so yes ok to order portable 02

## 2012-03-22 NOTE — Telephone Encounter (Signed)
I spoke with pt and she stated she does use 4 liters with exertion but 2 liters at rest. Also aware I have sent order to [facility] nothing further was needed

## 2012-04-05 ENCOUNTER — Ambulatory Visit: Payer: Medicare Other | Admitting: Internal Medicine

## 2012-04-06 ENCOUNTER — Encounter: Payer: Self-pay | Admitting: Internal Medicine

## 2012-04-06 ENCOUNTER — Ambulatory Visit (INDEPENDENT_AMBULATORY_CARE_PROVIDER_SITE_OTHER): Payer: Medicare Other | Admitting: Internal Medicine

## 2012-04-06 VITALS — BP 100/60 | HR 50 | Temp 97.3°F | Ht 63.0 in | Wt 94.0 lb

## 2012-04-06 DIAGNOSIS — J9610 Chronic respiratory failure, unspecified whether with hypoxia or hypercapnia: Secondary | ICD-10-CM

## 2012-04-06 DIAGNOSIS — J449 Chronic obstructive pulmonary disease, unspecified: Secondary | ICD-10-CM

## 2012-04-06 NOTE — Progress Notes (Signed)
Subjective:    Patient ID: Michelle Hopkins, female    DOB: 09-17-1926  MRN: [id number]  HPI  17 yowf quit smoking age 78 with baseline advair/ spiriva  no need for 02 could do HT but [facility] then [facility] Jan 2013  And "not right since" with breathing   DATE OF ADMISSION: 05/27/2011  DATE OF DISCHARGE: 06/11/2011  DISCHARGE SUMMARY  MEDICATIONS ON DISCHARGE:  1. Augmentin 250/62.5 suspension 15 mL b.i.d. x6 days.  2. Biotin liquid rinse twice daily.  3. Diltiazem 30 mg p.o. q.6 hours.  4. Resource Breeze 1 container 3 times daily with meals.  5. Xopenex nebs q.4 hours as needed for wheezing or shortness of  breath.  6. Beta-carotene/Ocuvite 1 tablet by mouth daily.  7. Advair 1 inhalation twice daily, 250/50.  8. Levothyroxine 88 mcg daily.  9. Remeron 15 mg p.o. at bedtime.  10.Omeprazole 20 mg by mouth daily, open or crush.  11.Spiriva 1 inhalation 18 mcg once daily.  12.Diflucan 200 mg p.o. daily x2 weeks.  FINAL DIAGNOSES:  1. Aspiration pneumonia.  2. Megaesophagus with poor motility.  3. Need for chronic liquid feeding.  4. Chronic debilitation.  5. Atrial fibrillation, no longer a Coumadin candidate because of GI  issues and inconsistent feeding.  6. Atrial fibrillation with rapid ventricular rate, resolved.  7. Postherpetic neuralgia, stable.  8. Depression.  9. Insomnia.  10.Osteoporosis.  11.Hypertension.  12.History of dysphagia and weight loss secondary to above.   09/02/2011 1st pulmonary eval cc doe x 50 ft on 4lpm , not really bothered by cough. On advair but not spiriva.   rec Spiriva each am Only use your xopenex  as a rescue medication     10/21/2011 f/u ov/Michelle Hopkins on 4lpm 24 h cc sob better, able to walk 189f s stopping   hasn't needed any saba daytime, no cough, feels spiriva helping and swallowing has improved. Rec Be very careful with swallowing that you don't get things stuck or get them down the wrong way - if swallowing gets any worse  may need an alternative to cardizem like bisoprolol   01/21/2012 f/u ov/Michelle Hopkins  on 4lpm, walking sleeping sitting> no limiting sob but very sedentary. rec No change in medications Please see patient coordinator before you leave today  to schedule pulmonary rehab> did not go  04/06/2012 f/u ov/Michelle Hopkins cc doe no change activity tol but very sedatary, did not understand 02 rx (see a/p). No obvious daytime variabilty or assoc chronic cough or cp or chest tightness, subjective wheeze overt sinus or hb symptoms. No unusual exp hx   Sleeping ok without nocturnal  or early am exacerbation  of respiratory  c/o's or need for noct saba. Also denies any obvious fluctuation of symptoms with weather or environmental changes or other aggravating or alleviating factors except as outlined above.  ROS  At present neg for  any significant sore throat, dysphagia, dental problems, itching, sneezing,  nasal congestion or excess/ purulent secretions, ear ache,   fever, chills, sweats, unintended wt loss, pleuritic or exertional cp, hemoptysis, palpitations, orthopnea pnd or leg swelling.  Also denies presyncope, palpitations, heartburn, abdominal pain, anorexia, nausea, vomiting, diarrhea  or change in bowel or urinary habits, change in stools or urine, dysuria,hematuria,  rash, arthralgias, visual complaints, headache, numbness weakness . No noted change in mood/affect or memory.             Objective:   Physical Exam 09/02/2011  88 > 89 10/21/2011 >  01/21/2012  91 > 04/06/2012  89 Elderly wf with / ? choreathetoid movement disorder  HEENT mild turbinate edema.  Oropharynx no thrush or excess pnd or cobblestoning.  No JVD or cervical adenopathy. Mild accessory muscle hypertrophy. Trachea midline, nl thryroid. Chest was hyperinflated by percussion with diminished breath sounds and moderate increased exp time without wheeze. Hoover sign positive at mid inspiration. Regular rate and rhythm without murmur gallop or rub or  increase P2 or edema.  Abd: no hsm, nl excursion. Ext warm without cyanosis or clubbing.   CXR  09/02/2011 :  Near complete resolution of bilateral air space opacities and  pleural effusions. No significant residual material identified  within the esophageal lumen.       Assessment & Plan:

## 2012-04-06 NOTE — Patient Instructions (Addendum)
02 2lpm resting and sleeping but 4 lpm with any acitivity  Please schedule a follow up visit in 3 months but call sooner if needed

## 2012-04-07 ENCOUNTER — Ambulatory Visit: Payer: Medicare Other | Admitting: Internal Medicine

## 2012-04-08 NOTE — Assessment & Plan Note (Signed)
-   4lpm NP 24/7 since discharge 05/2011    - 01/21/2012   Walked 4lom  xone lap @ 185 stopped due to  Sob but sat still 94% so needs 4 lpm with acitivty, 2lpm ok at rest/sleeping   I had an extended discussion with the patient  And son today lasting 15 to 20 minutes of a 25 minute visit on the following issues:   rx was reviewed again to help them understand that just like many pts need two pairs of glasses, one for reading and one for distance, I had recommended she use two settings for her 02 :  2lpm at rest (which includes sleeping) and 4lpm with activity.

## 2012-04-08 NOTE — Assessment & Plan Note (Signed)
-   Added back spiriva 09/02/2011    - PFT's 10/14/11  FEV1  0.65 (45%) ratio 42 and 12% better p B2, DLC0 30 corrects to 47%   - refer to pulmonary rehab 01/21/2012 > did not go  Adequate control on present rx, reviewed rx

## 2012-04-25 ENCOUNTER — Other Ambulatory Visit: Payer: Self-pay | Admitting: Internal Medicine

## 2012-05-06 ENCOUNTER — Telehealth: Payer: Self-pay | Admitting: Internal Medicine

## 2012-05-06 MED ORDER — TIOTROPIUM BROMIDE MONOHYDRATE 18 MCG IN CAPS: 18.0000 ug | ORAL_CAPSULE | Freq: Every day | RESPIRATORY_TRACT | Status: DC

## 2012-05-06 NOTE — Telephone Encounter (Signed)
Patient last seen by MW 11.13.13, follow up in 3 months.  Called spoke with patient who reported that her spiriva costs her $350 per month.  Advised that samples x2 have been left up front for her to pick up at her convenience and asked if we need to find a cheaper alternative for her.  Pt stated that she will discuss this w/ MW at her next ov; asked her to please call if we can do anything further for her.  Samples documented.

## 2012-06-14 ENCOUNTER — Telehealth: Payer: Self-pay | Admitting: Internal Medicine

## 2012-06-14 NOTE — Telephone Encounter (Signed)
Pt is aware. She will get this at her appt. Nothing further was needed

## 2012-06-14 NOTE — Telephone Encounter (Signed)
yes

## 2012-06-14 NOTE — Telephone Encounter (Signed)
I spoke with pt. She is wanting to know if MW will sign for a handicap placard at her appt on 07/04/12. Please advise MW thanks

## 2012-07-04 ENCOUNTER — Encounter: Payer: Self-pay | Admitting: Internal Medicine

## 2012-07-04 ENCOUNTER — Ambulatory Visit (INDEPENDENT_AMBULATORY_CARE_PROVIDER_SITE_OTHER): Payer: Medicare Other | Admitting: Internal Medicine

## 2012-07-04 VITALS — BP 100/62 | HR 88 | Temp 97.4°F | Ht 63.0 in | Wt 90.0 lb

## 2012-07-04 DIAGNOSIS — J9610 Chronic respiratory failure, unspecified whether with hypoxia or hypercapnia: Secondary | ICD-10-CM

## 2012-07-04 DIAGNOSIS — J449 Chronic obstructive pulmonary disease, unspecified: Secondary | ICD-10-CM

## 2012-07-04 NOTE — Progress Notes (Signed)
Subjective:    Patient ID: Michelle Hopkins, female    DOB: 04-03-1927  MRN: [id number]  HPI  79 yowf quit smoking age 79 with baseline advair/ spiriva  no need for 02 could do HT but [facility] then [facility] Jan [facility]  And "not right since" with breathing   DATE OF ADMISSION: 05/27/2011  DATE OF DISCHARGE: 06/11/2011  DISCHARGE SUMMARY  MEDICATIONS ON DISCHARGE:  1. Augmentin 250/62.5 suspension 15 mL b.i.d. x6 days.  2. Biotin liquid rinse twice daily.  3. Diltiazem 30 mg p.o. q.6 hours.  4. Resource Breeze 1 container 3 times daily with meals.  5. Xopenex nebs q.4 hours as needed for wheezing or shortness of  breath.  6. Beta-carotene/Ocuvite 1 tablet by mouth daily.  7. Advair 1 inhalation twice daily, 250/50.  8. Levothyroxine 88 mcg daily.  9. Remeron 15 mg p.o. at bedtime.  10.Omeprazole 20 mg by mouth daily, open or crush.  11.Spiriva 1 inhalation 18 mcg once daily.  12.Diflucan 200 mg p.o. daily x2 weeks.  FINAL DIAGNOSES:  1. Aspiration pneumonia.  2. Megaesophagus with poor motility.  3. Need for chronic liquid feeding.  4. Chronic debilitation.  5. Atrial fibrillation, no longer a Coumadin candidate because of GI  issues and inconsistent feeding.  6. Atrial fibrillation with rapid ventricular rate, resolved.  7. Postherpetic neuralgia, stable.  8. Depression.  9. Insomnia.  10.Osteoporosis.  11.Hypertension.  12.History of dysphagia and weight loss secondary to above.   09/02/2011 1st pulmonary eval cc doe x 50 ft on 4lpm , not really bothered by cough. On advair but not spiriva.   rec Spiriva each am Only use your xopenex  as a rescue medication     10/21/2011 f/u ov/Michelle Hopkins on 4lpm 24 h cc sob better, able to walk 158f s stopping   hasn't needed any saba daytime, no cough, feels spiriva helping and swallowing has improved. Rec Be very careful with swallowing that you don't get things stuck or get them down the wrong way - if swallowing gets any worse  may need an alternative to cardizem like bisoprolol   01/21/2012 f/u ov/Michelle Hopkins  on 4lpm, walking sleeping sitting> no limiting sob but very sedentary. rec No change in medications Please see patient coordinator before you leave today  to schedule pulmonary rehab> did not go  04/06/2012 f/u ov/Michelle Hopkins cc doe no change activity tol but very sedatary, did not understand 02 rx (see a/p). rec 02 2lpm resting and sleeping but 4 lpm with any acitivity   07/04/2012 f/u ov/Michelle Hopkins cc no change doe on 02 only walking about 50 ft on it, really not limited by breathing as long as she wears it but want off.   No obvious daytime variabilty or assoc chronic cough or cp or chest tightness, subjective wheeze overt sinus or hb symptoms. No unusual exp hx   Sleeping ok without nocturnal  or early am exacerbation  of respiratory  c/o's or need for noct saba. Also denies any obvious fluctuation of symptoms with weather or environmental changes or other aggravating or alleviating factors except as outlined above.  ROS  At present neg for  any significant sore throat, dysphagia, dental problems, itching, sneezing,  nasal congestion or excess/ purulent secretions, ear ache,   fever, chills, sweats, unintended wt loss, pleuritic or exertional cp, hemoptysis, palpitations, orthopnea pnd or leg swelling.  Also denies presyncope, palpitations, heartburn, abdominal pain, anorexia, nausea, vomiting, diarrhea  or change in bowel or urinary habits,  change in stools or urine, dysuria,hematuria,  rash, arthralgias, visual complaints, headache, numbness weakness . No noted change in mood/affect or memory.             Objective:   Physical Exam 09/02/2011  88 > 89 10/21/2011 > 01/21/2012  91 > 04/06/2012  94 > 90 07/04/2012  Elderly wf with / ? choreathetoid movement disorder  HEENT mild turbinate edema.  Oropharynx no thrush or excess pnd or cobblestoning.  No JVD or cervical adenopathy. Mild accessory muscle hypertrophy. Trachea  midline, nl thryroid. Chest was hyperinflated by percussion with diminished breath sounds and moderate increased exp time without wheeze. Hoover sign positive at mid inspiration. Regular rate and rhythm without murmur gallop or rub or increase P2 or edema.  Abd: no hsm, nl excursion. Ext warm without cyanosis or clubbing.   CXR  09/02/2011 :  Near complete resolution of bilateral air space opacities and  pleural effusions. No significant residual material identified  within the esophageal lumen.       Assessment & Plan:

## 2012-07-04 NOTE — Assessment & Plan Note (Signed)
-   Added back spiriva 09/02/2011    - PFT's 10/14/11  FEV1  0.65 (45%) ratio 42 and 12% better p B2, DLC0 30 corrects to 47%   - refer to pulmonary rehab 01/21/2012  > declined    Each maintenance medication was reviewed in detail including most importantly the difference between maintenance and as needed and under what circumstances the prns are to be used.  Please see instructions for details which were reviewed in writing and the patient given a copy.

## 2012-07-04 NOTE — Patient Instructions (Addendum)
Ok to take off 02 at rest and walking across the room but other wise wear 2lpm (for example at bedtime) and increase to 4lpm with exertion  Please schedule a follow up visit in 3 months but call sooner if needed

## 2012-07-04 NOTE — Assessment & Plan Note (Signed)
-   4lpm NP 24/7 since discharge 05/2011    - 01/21/2012   Walked 4lpm x one lap @ 185 stopped due to  Sob but sat still 94% so RX = 4 lpm with acitivty, 2lpm ok at rest/sleeping   - 07/04/2012   Walked RA x one half  lap or about 90 ft stopped due to  desat to 82% so ok to leave 02 off at rest and walking room to room

## 2012-10-07 ENCOUNTER — Telehealth: Payer: Self-pay | Admitting: Internal Medicine

## 2012-10-07 MED ORDER — TIOTROPIUM BROMIDE MONOHYDRATE 18 MCG IN CAPS: 18.0000 ug | ORAL_CAPSULE | Freq: Every day | RESPIRATORY_TRACT | Status: DC

## 2012-10-07 NOTE — Telephone Encounter (Signed)
Called, spoke with pt. Verified she would like spiriva samples. States she is now in the donut hole and medication will be over $700. Pt would like to apply for the pt assistance program through [employer]. I have placed this form at the front along with the 2 spiriva samples. Pt is aware to bring the application back along with her financial documents so we can send off to co with rx. She verbalized understanding and was very appreciative of this.

## 2012-10-11 ENCOUNTER — Ambulatory Visit: Payer: Medicare Other | Admitting: Internal Medicine

## 2012-11-10 ENCOUNTER — Ambulatory Visit: Payer: Medicare Other | Admitting: Internal Medicine

## 2012-11-10 ENCOUNTER — Ambulatory Visit (INDEPENDENT_AMBULATORY_CARE_PROVIDER_SITE_OTHER)
Admission: RE | Admit: 2012-11-10 | Discharge: 2012-11-10 | Disposition: A | Discharge status: Home / Self Care | Payer: Medicare Other | Source: Ambulatory Visit | Attending: Internal Medicine | Admitting: Internal Medicine

## 2012-11-10 ENCOUNTER — Other Ambulatory Visit (INDEPENDENT_AMBULATORY_CARE_PROVIDER_SITE_OTHER): Payer: Medicare Other

## 2012-11-10 ENCOUNTER — Ambulatory Visit (INDEPENDENT_AMBULATORY_CARE_PROVIDER_SITE_OTHER): Payer: Medicare Other | Admitting: Internal Medicine

## 2012-11-10 ENCOUNTER — Encounter: Payer: Self-pay | Admitting: Internal Medicine

## 2012-11-10 VITALS — BP 118/70 | HR 72 | Temp 97.4°F | Ht 62.0 in | Wt 86.4 lb

## 2012-11-10 DIAGNOSIS — J9610 Chronic respiratory failure, unspecified whether with hypoxia or hypercapnia: Secondary | ICD-10-CM

## 2012-11-10 DIAGNOSIS — J449 Chronic obstructive pulmonary disease, unspecified: Secondary | ICD-10-CM

## 2012-11-10 DIAGNOSIS — J4489 Other specified chronic obstructive pulmonary disease: Secondary | ICD-10-CM

## 2012-11-10 DIAGNOSIS — R634 Abnormal weight loss: Secondary | ICD-10-CM

## 2012-11-10 LAB — BASIC METABOLIC PANEL
CO2: 30 mEq/L (ref 19–32)
Calcium: 9.9 mg/dL (ref 8.4–10.5)
Creatinine, Ser: 0.8 mg/dL (ref 0.4–1.2)
GFR: 78.25 mL/min (ref >60.00)
Sodium: 140 mEq/L (ref 135–145)

## 2012-11-10 MED ORDER — TIOTROPIUM BROMIDE MONOHYDRATE 18 MCG IN CAPS: 18.0000 ug | ORAL_CAPSULE | Freq: Every day | RESPIRATORY_TRACT | Status: DC

## 2012-11-10 NOTE — Assessment & Plan Note (Signed)
-   Added back spiriva 09/02/2011    - PFT's 10/14/11  FEV1  0.65 (45%) ratio 42 and 12% better p B2, DLC0 30 corrects to 47%   - refer to pulmonary rehab 01/21/2012  > declined  Nothing more to offer at this point.    Each maintenance medication was reviewed in detail including most importantly the difference between maintenance and as needed and under what circumstances the prns are to be used.  Please see instructions for details which were reviewed in writing and the patient given a copy.

## 2012-11-10 NOTE — Progress Notes (Signed)
Subjective:    Patient ID: Michelle Hopkins, female    DOB: [birth year]  MRN: [id number]  HPI  64 yowf quit smoking age 79 with baseline advair/ spiriva  no need for 02 could do HT but used [facility] then hosp at [facility] Jan 2013  And "not right since" with breathing   DATE OF ADMISSION: 05/27/2011  DATE OF DISCHARGE: 06/11/2011  DISCHARGE SUMMARY  MEDICATIONS ON DISCHARGE:  1. Augmentin 250/62.5 suspension 15 mL b.i.d. x6 days.  2. Biotin liquid rinse twice daily.  3. Diltiazem 30 mg p.o. q.6 hours.  4. Resource Breeze 1 container 3 times daily with meals.  5. Xopenex nebs q.4 hours as needed for wheezing or shortness of  breath.  6. Beta-carotene/Ocuvite 1 tablet by mouth daily.  7. Advair 1 inhalation twice daily, 250/50.  8. Levothyroxine 88 mcg daily.  9. Remeron 15 mg p.o. at bedtime.  10.Omeprazole 20 mg by mouth daily, open or crush.  11.Spiriva 1 inhalation 18 mcg once daily.  12.Diflucan 200 mg p.o. daily x2 weeks.  FINAL DIAGNOSES:  1. Aspiration pneumonia.  2. Megaesophagus with poor motility.  3. Need for chronic liquid feeding.  4. Chronic debilitation.  5. Atrial fibrillation, no longer a Coumadin candidate because of GI  issues and inconsistent feeding.  6. Atrial fibrillation with rapid ventricular rate, resolved.  7. Postherpetic neuralgia, stable.  8. Depression.  9. Insomnia.  10.Osteoporosis.  11.Hypertension.  12.History of dysphagia and weight loss secondary to above.   09/02/2011 1st pulmonary eval cc doe x 50 ft on 4lpm , not really bothered by cough. On advair but not spiriva.   rec Spiriva each am Only use your xopenex  as a rescue medication     10/21/2011 f/u ov/Sravya Grissom on 4lpm 24 h cc sob better, able to walk 127f s stopping   hasn't needed any saba daytime, no cough, feels spiriva helping and swallowing has improved. Rec Be very careful with swallowing that you don't get things stuck or get them down the wrong way - if swallowing gets any worse  may need an alternative to cardizem like bisoprolol   01/21/2012 f/u ov/Carvel Huskins  on 4lpm, walking sleeping sitting> no limiting sob but very sedentary. rec No change in medications Please see patient coordinator before you leave today  to schedule pulmonary rehab> did not go  04/06/2012 f/u ov/Arlethia Basso cc doe no change activity tol but very sedatary, did not understand 02 rx (see a/p). rec 02 2lpm resting and sleeping but 4 lpm with any acitivity   07/04/2012 f/u ov/Shayona Hibbitts cc no change doe on 02 only walking about 50 ft on it, really not limited by breathing as long as she wears it but want off.  rec Ok to take off 02 at rest and walking across the room but other wise wear 2lpm (for example at bedtime) and increase to 4lpm with exertion   11/10/2012 f/u ov/Pelham Hennick  Chief Complaint  Patient presents with  . Follow-up    Pt states her breathing is doing well. Family reports she struggles with her breathing though, and has been taking her o2 off for approx 4 hrs at a time. Her sats when she arrived were 77% 3lpm and this increased to   one week worse with poor appetite, breathing about the same per pt, no difficulty swallowing, no cough or cp.  No obvious daytime variabilty or assoc  chest tightness, subjective wheeze overt sinus or hb symptoms. No unusual exp hx   Sleeping ok  without nocturnal  or early am exacerbation  of respiratory  c/o's or need for noct saba. Also denies any obvious fluctuation of symptoms with weather or environmental changes or other aggravating or alleviating factors except as outlined above.  Current Medications, Allergies, Past Medical History, Past Surgical History, Family History, and Social History were reviewed in [employer] record.  ROS  The following are not active complaints unless bolded sore throat, dysphagia, dental problems, itching, sneezing,  nasal congestion or excess/ purulent secretions, ear ache,   fever, chills, sweats, unintended wt  loss, pleuritic or exertional cp, hemoptysis,  orthopnea pnd or leg swelling, presyncope, palpitations, heartburn, abdominal pain, anorexia, nausea, vomiting, diarrhea  or change in bowel or urinary habits, change in stools or urine, dysuria,hematuria,  rash, arthralgias, visual complaints, headache, numbness weakness or ataxia or problems with walking or coordination,  change in mood/affect or memory.                 Objective:   Physical Exam 09/02/2011  88 > 89 10/21/2011 > 01/21/2012  91 > 04/06/2012  94 > 90 07/04/2012 >  86 11/10/2012  Elderly wf with ? choreathetoid movement disorder  HEENT mild turbinate edema.  Oropharynx no thrush or excess pnd or cobblestoning.  No JVD or cervical adenopathy. Mild accessory muscle hypertrophy. Trachea midline, nl thryroid. Chest was hyperinflated by percussion with diminished breath sounds and moderate increased exp time without wheeze. Hoover sign positive at mid inspiration. Regular rate and rhythm without murmur gallop or rub or increase P2 or edema.  Abd: no hsm, nl excursion. Ext warm without cyanosis or clubbing.     CXR  11/10/2012 :  COPD. No acute cardiopulmonary abnormality.       Assessment & Plan:

## 2012-11-10 NOTE — Progress Notes (Signed)
Quick Note:  Spoke with pt and notified of results per Dr. Wert. Pt verbalized understanding and denied any questions.  ______ 

## 2012-11-10 NOTE — Patient Instructions (Addendum)
Please remember to go to the lab and x-ray department downstairs for your tests - we will call you with the results when they are available.  Ok to leave off 02 only during the day at rest, and only when 02 sat is over 88%  Please schedule a follow up office visit in 6 weeks, call sooner if needed

## 2012-11-10 NOTE — Assessment & Plan Note (Signed)
-   4lpm NP 24/7 since discharge 05/2011    - 01/21/2012   Walked 4lpm x one lap @ 185 stopped due to  Sob but sat still 94% so RX = 4 lpm with acitivty, 2lpm ok at rest/sleeping   - 07/04/2012   Walked RA x one half  lap or about 90 ft stopped due to  desat to 82% so ok to leave 02 off at rest and walking room to room   Remains 02 dep most of the time - cautioned should not stop 02 if not monitoring sats with goal > 88%.

## 2012-11-11 LAB — CBC WITH DIFFERENTIAL/PLATELET
Basophils Absolute: 0 10*3/uL (ref 0.0–0.1)
HCT: 40.4 % (ref 36.0–46.0)
Hemoglobin: 13.1 g/dL (ref 12.0–15.0)
Lymphs Abs: 0.9 10*3/uL (ref 0.7–4.0)
MCHC: 32.3 g/dL (ref 30.0–36.0)
MCV: 85.7 fl (ref 78.0–100.0)
Monocytes Absolute: 0.6 10*3/uL (ref 0.1–1.0)
Monocytes Relative: 7.9 % (ref 3.0–12.0)
Neutro Abs: 6.5 10*3/uL (ref 1.4–7.7)
Platelets: 155 10*3/uL (ref 150.0–400.0)
RDW: 15.5 % — ABNORMAL HIGH (ref 11.5–14.6)

## 2012-11-14 ENCOUNTER — Telehealth: Payer: Self-pay | Admitting: Internal Medicine

## 2012-11-14 DIAGNOSIS — R634 Abnormal weight loss: Secondary | ICD-10-CM

## 2012-11-14 NOTE — Telephone Encounter (Signed)
Pt calling in ref to previous msg can be reached at [phone].Michelle Hopkins

## 2012-11-14 NOTE — Telephone Encounter (Signed)
ATC pt x2 > line busy.  WCB   Will route back to RA

## 2012-11-14 NOTE — Progress Notes (Signed)
Quick Note:  Spoke with pt and notified of results per Dr. Wert. Pt verbalized understanding and denied any questions.  ______ 

## 2012-11-14 NOTE — Telephone Encounter (Signed)
Per 6.19.14 lab results: Result Notes    Notes Recorded by Nyoka Cowden, MD on 11/11/2012 at 8:58 PM Call patient : Study is unremarkable x K is elevated > make sure she's not On any K and have her repeat it in first of week   Called spoke with pt's daughter Purnell Shoemaker.  Advised of lab results as stated by MW above.  Purnell Shoemaker verified that pt does NOT have any K supplements at home but does report that pt eats a banana every day.  Jeralene Peters to hold the bananas for now and she will bring patient this week for repeat BMET (order placed).    Purnell Shoemaker is also requesting to know if MW thinks it's okay to take pt to the beach for a week beginning 6.30.14.  Per the last ov note pt was 77% on 3lpm upon arrival.  If pt is able to go to the beach, will need an updated O2 order faxed to [facility] (P: [phone], she has been working with Daron [employer]).  Purnell Shoemaker stated that they worked with this company last year for [employer] trip, and they were very helpful with supplying a concentrator during the stay.  MW is off this week. Dr Vassie Loll, is there any way you can provide insight for the daughter?  Thank you.  Patient Instructions    Please remember to go to the lab and x-ray department downstairs for your tests - we will call you with the results when they are available.  Ok to leave off 02 only during the day at rest, and only when 02 sat is over 88%  Please schedule a follow up office visit in 6 weeks, call sooner if needed

## 2012-11-14 NOTE — Telephone Encounter (Signed)
OK to go if satn doing better & above 88%  Otherwise no

## 2012-11-14 NOTE — Telephone Encounter (Signed)
Spoke with pt's daughter  Purnell Shoemaker and notified of recs per RA She verbalized understanding and states nothing further needed

## 2012-11-15 ENCOUNTER — Telehealth: Payer: Self-pay | Admitting: Internal Medicine

## 2012-11-15 ENCOUNTER — Telehealth: Payer: Self-pay | Admitting: Critical Care Medicine

## 2012-11-15 ENCOUNTER — Other Ambulatory Visit (INDEPENDENT_AMBULATORY_CARE_PROVIDER_SITE_OTHER): Payer: Medicare Other

## 2012-11-15 DIAGNOSIS — R634 Abnormal weight loss: Secondary | ICD-10-CM

## 2012-11-15 DIAGNOSIS — E875 Hyperkalemia: Secondary | ICD-10-CM

## 2012-11-15 LAB — BASIC METABOLIC PANEL
BUN: 10 mg/dL (ref 6–23)
Calcium: 9.6 mg/dL (ref 8.4–10.5)
GFR: 72.63 mL/min (ref >60.00)
Glucose, Bld: 144 mg/dL — ABNORMAL HIGH (ref 70–99)
Sodium: 140 mEq/L (ref 135–145)

## 2012-11-15 NOTE — Telephone Encounter (Signed)
I spoke to dr Evlyn Kanner who will handle  He had not received the fax of the labs  I faxed the labs again

## 2012-11-15 NOTE — Telephone Encounter (Signed)
Daughter called.  Said our office referred the K of 6.0 to [facility] but they called the daughter and told her Michelle Hopkins is to handle this.   I will page [facility] and told the daughter likely due to volume depletion and low thyroid function. On no meds to cause high potassium  I will ask dr on for [facility] to f/u and handle as wert only doing pulmonary spiriva/advair RX

## 2012-11-15 NOTE — Progress Notes (Signed)
Quick Note:  Spoke with pt and her daughter and notified of results per Dr. Sherene Sires.  Daughter states that she is NOT taking any K and that our med list is correct I have forwarded a copy of the labs to Dr Sandra Cockayne and they will call to ensure fu on this  ______

## 2012-11-15 NOTE — Telephone Encounter (Signed)
Notes Recorded by Nyoka Cowden, MD on 11/15/2012 at 1:53 PM Call patient : Study is c/w unexplained hyperkalemia ? Fictitious vs related to meds not her list > be sure our list is correct and forward to her primary   Daughter is aware. Results faxed over.

## 2012-11-22 ENCOUNTER — Other Ambulatory Visit (HOSPITAL_COMMUNITY): Payer: Medicare Other

## 2012-11-29 ENCOUNTER — Telehealth: Payer: Self-pay | Admitting: Internal Medicine

## 2012-11-29 MED ORDER — TIOTROPIUM BROMIDE MONOHYDRATE 18 MCG IN CAPS: 18.0000 ug | ORAL_CAPSULE | Freq: Every day | RESPIRATORY_TRACT | Status: DC

## 2012-11-29 NOTE — Telephone Encounter (Signed)
Patient requesting spiriva samples until patient assistance goes through Samples placed upfront, patient is aware and nothing further needed at this time

## 2012-11-30 ENCOUNTER — Ambulatory Visit (HOSPITAL_COMMUNITY): Payer: Medicare Other | Attending: Cardiology | Admitting: Radiology

## 2012-11-30 ENCOUNTER — Other Ambulatory Visit (HOSPITAL_COMMUNITY): Payer: Self-pay | Admitting: Internal Medicine

## 2012-11-30 DIAGNOSIS — R0602 Shortness of breath: Secondary | ICD-10-CM | POA: Insufficient documentation

## 2012-11-30 DIAGNOSIS — I059 Rheumatic mitral valve disease, unspecified: Secondary | ICD-10-CM | POA: Insufficient documentation

## 2012-11-30 DIAGNOSIS — R0600 Dyspnea, unspecified: Secondary | ICD-10-CM

## 2012-11-30 DIAGNOSIS — I4891 Unspecified atrial fibrillation: Secondary | ICD-10-CM | POA: Insufficient documentation

## 2012-11-30 DIAGNOSIS — J4489 Other specified chronic obstructive pulmonary disease: Secondary | ICD-10-CM | POA: Insufficient documentation

## 2012-11-30 DIAGNOSIS — E039 Hypothyroidism, unspecified: Secondary | ICD-10-CM | POA: Insufficient documentation

## 2012-11-30 DIAGNOSIS — I10 Essential (primary) hypertension: Secondary | ICD-10-CM | POA: Insufficient documentation

## 2012-11-30 DIAGNOSIS — J449 Chronic obstructive pulmonary disease, unspecified: Secondary | ICD-10-CM | POA: Insufficient documentation

## 2012-11-30 DIAGNOSIS — I079 Rheumatic tricuspid valve disease, unspecified: Secondary | ICD-10-CM | POA: Insufficient documentation

## 2012-11-30 NOTE — Progress Notes (Signed)
Echocardiogram performed.  

## 2012-12-06 ENCOUNTER — Telehealth: Payer: Self-pay | Admitting: Internal Medicine

## 2012-12-06 NOTE — Telephone Encounter (Signed)
Called and spoke with rep at the pt assistance program  I advised okay to process the forms for pt assistance  She verbalized understanding and states nothing further needed

## 2012-12-22 ENCOUNTER — Encounter: Payer: Self-pay | Admitting: Internal Medicine

## 2012-12-22 ENCOUNTER — Ambulatory Visit (INDEPENDENT_AMBULATORY_CARE_PROVIDER_SITE_OTHER): Payer: Medicare Other | Admitting: Internal Medicine

## 2012-12-22 VITALS — BP 160/100 | Temp 97.3°F | Ht 63.0 in | Wt 86.8 lb

## 2012-12-22 DIAGNOSIS — J9610 Chronic respiratory failure, unspecified whether with hypoxia or hypercapnia: Secondary | ICD-10-CM

## 2012-12-22 DIAGNOSIS — I10 Essential (primary) hypertension: Secondary | ICD-10-CM

## 2012-12-22 DIAGNOSIS — J449 Chronic obstructive pulmonary disease, unspecified: Secondary | ICD-10-CM

## 2012-12-22 NOTE — Patient Instructions (Addendum)
Continue advair and spiriva  Only use your albuterol as a rescue medication to be used if you can't catch your breath by resting or doing a relaxed purse lip breathing pattern. The less you use it, the better it will work when you need it. Ok to use to up every 4 hours if needed if you feel it helps do more - if so please make an appt to see my NP Tammy to get you a longer acting form of albuterol for your neb  Please schedule a follow up visit in 3 months but call sooner if needed

## 2012-12-22 NOTE — Progress Notes (Signed)
Subjective:    Patient ID: Michelle Hopkins, female    DOB: 1927/04/05  MRN: [id number]  HPI  79 yowf quit smoking age 79 with baseline advair/ spiriva  no need for 02 could do HT but used [facility] then hosp at [facility] Jan 2013  And "not right since" with breathing   DATE OF ADMISSION: 05/27/2011  DATE OF DISCHARGE: 06/11/2011  DISCHARGE SUMMARY  MEDICATIONS ON DISCHARGE:  1. Augmentin 250/62.5 suspension 15 mL b.i.d. x6 days.  2. Biotin liquid rinse twice daily.  3. Diltiazem 30 mg p.o. q.6 hours.  4. Resource Breeze 1 container 3 times daily with meals.  5. Xopenex nebs q.4 hours as needed for wheezing or shortness of  breath.  6. Beta-carotene/Ocuvite 1 tablet by mouth daily.  7. Advair 1 inhalation twice daily, 250/50.  8. Levothyroxine 88 mcg daily.  9. Remeron 15 mg p.o. at bedtime.  10.Omeprazole 20 mg by mouth daily, open or crush.  11.Spiriva 1 inhalation 18 mcg once daily.  12.Diflucan 200 mg p.o. daily x2 weeks.  FINAL DIAGNOSES:  1. Aspiration pneumonia.  2. Megaesophagus with poor motility.  3. Need for chronic liquid feeding.  4. Chronic debilitation.  5. Atrial fibrillation, no longer a Coumadin candidate because of GI  issues and inconsistent feeding.  6. Atrial fibrillation with rapid ventricular rate, resolved.  7. Postherpetic neuralgia, stable.  8. Depression.  9. Insomnia.  10.Osteoporosis.  11.Hypertension.  12.History of dysphagia and weight loss secondary to above.   09/02/2011 1st pulmonary eval cc doe x 50 ft on 4lpm , not really bothered by cough. On advair but not spiriva.   rec Spiriva each am Only use your xopenex  as a rescue medication     10/21/2011 f/u ov/Michelle Hopkins on 4lpm 24 h cc sob better, able to walk 174f s stopping   hasn't needed any saba daytime, no cough, feels spiriva helping and swallowing has improved. Rec Be very careful with swallowing that you don't get things stuck or get them down the wrong way - if swallowing gets any worse  may need an alternative to cardizem like bisoprolol   01/21/2012 f/u ov/Michelle Hopkins  on 4lpm, walking sleeping sitting> no limiting sob but very sedentary. rec No change in medications Please see patient coordinator before you leave today  to schedule pulmonary rehab> did not go  04/06/2012 f/u ov/Michelle Hopkins cc doe no change activity tol but very sedatary, did not understand 02 rx (see a/p). rec 02 2lpm resting and sleeping but 4 lpm with any acitivity   07/04/2012 f/u ov/Michelle Hopkins cc no change doe on 02 only walking about 50 ft on it, really not limited by breathing as long as she wears it but want off.  rec Ok to take off 02 at rest and walking across the room but other wise wear 2lpm (for example at bedtime) and increase to 4lpm with exertion   11/10/2012 f/u ov/Michelle Hopkins  Chief Complaint  Patient presents with  . Follow-up    Pt states her breathing is doing well. Family reports she struggles with her breathing though, and has been taking her o2 off for approx 4 hrs at a time. Her sats when she arrived were 77% 3lpm and this increased to   one week worse with poor appetite, breathing about the same per pt, no difficulty swallowing, no cough or cp. rec Please remember to go to the lab and x-ray department downstairs for your tests - we will call you with the results when they  are available. Ok to leave off 02 only during the day at rest, and only when 02 sat is over 88%   12/22/2012 f/u ov/Michelle Hopkins re copd Chief Complaint  Patient presents with  . Follow-up    Pt states breathing unchnaged since her last visit. She states has no energy and appetite is poor.  She had hemoptysis x 2 wks ago, and Dr Ashok Pall txed her with zpack with helped.     No obvious daytime variabilty or assoc  chest tightness, subjective wheeze overt sinus or hb symptoms. No unusual exp hx   Sleeping ok without nocturnal  or early am exacerbation  of respiratory  c/o's or need for noct saba. Also denies any obvious fluctuation of  symptoms with weather or environmental changes or other aggravating or alleviating factors except as outlined above.  Current Medications, Allergies, Past Medical History, Past Surgical History, Family History, and Social History were reviewed in [employer] record.  ROS  The following are not active complaints unless bolded sore throat, dysphagia, dental problems, itching, sneezing,  nasal congestion or excess/ purulent secretions, ear ache,   fever, chills, sweats, unintended wt loss, pleuritic or exertional cp, hemoptysis,  orthopnea pnd or leg swelling, presyncope, palpitations, heartburn, abdominal pain, anorexia, nausea, vomiting, diarrhea  or change in bowel or urinary habits, change in stools or urine, dysuria,hematuria,  rash, arthralgias, visual complaints, headache, numbness weakness or ataxia or problems with walking or coordination,  change in mood/affect or memory.                 Objective:   Physical Exam 09/02/2011  88 > 89 10/21/2011 > 01/21/2012  91 > 04/06/2012  94 > 90 07/04/2012 >  86 11/10/2012 > 12/22/2012  97  Elderly wf with ? choreathetoid movement disorder  HEENT mild turbinate edema.  Oropharynx no thrush or excess pnd or cobblestoning.  No JVD or cervical adenopathy. Mild accessory muscle hypertrophy. Trachea midline, nl thryroid. Chest was hyperinflated by percussion with diminished breath sounds and moderate increased exp time without wheeze. Hoover sign positive at mid inspiration. Regular rate and rhythm without murmur gallop or rub or increase P2 or edema.  Abd: no hsm, nl excursion. Ext warm without cyanosis or clubbing.     CXR  11/10/2012 :  COPD. No acute cardiopulmonary abnormality.       Assessment & Plan:

## 2012-12-22 NOTE — Assessment & Plan Note (Addendum)
-   02 rx 24/7 since discharge 05/2011    - 01/21/2012   Walked 4lpm x one lap @ 185 stopped due to  Sob but sat still 94% so RX = 4 lpm with acitivty, 2lpm ok at rest/sleeping   - 07/04/2012   Walked RA x one half  lap or about 90 ft stopped due to  desat to 82% so ok to leave 02 off at rest and walking room to room  rx = RA for rest, 2lpm sleep, 4lpm walking more than room room, reviewed

## 2012-12-25 ENCOUNTER — Encounter: Payer: Self-pay | Admitting: Internal Medicine

## 2012-12-25 NOTE — Assessment & Plan Note (Addendum)
-   Added back spiriva 09/02/2011    - PFT's 10/14/11  FEV1  0.65 (45%) ratio 42 and 12% better p B2, DLC0 30 corrects to 47%   - refer to pulmonary rehab 01/21/2012  > declined  Adequate control on present rx, reviewed > no change in rx needed   

## 2012-12-25 NOTE — Assessment & Plan Note (Signed)
Not Adequate control on present rx, reviewed >  F/u with Dr Wylene Simmer

## 2013-02-22 ENCOUNTER — Ambulatory Visit: Payer: Medicare Other | Admitting: Internal Medicine

## 2013-02-24 ENCOUNTER — Ambulatory Visit: Payer: Medicare Other | Admitting: Internal Medicine

## 2013-02-27 ENCOUNTER — Ambulatory Visit (INDEPENDENT_AMBULATORY_CARE_PROVIDER_SITE_OTHER): Payer: Medicare Other | Admitting: Internal Medicine

## 2013-02-27 ENCOUNTER — Encounter: Payer: Self-pay | Admitting: Internal Medicine

## 2013-02-27 VITALS — BP 138/88 | HR 82 | Temp 97.1°F | Ht 62.0 in | Wt 87.8 lb

## 2013-02-27 DIAGNOSIS — J9610 Chronic respiratory failure, unspecified whether with hypoxia or hypercapnia: Secondary | ICD-10-CM

## 2013-02-27 DIAGNOSIS — J449 Chronic obstructive pulmonary disease, unspecified: Secondary | ICD-10-CM

## 2013-02-27 NOTE — Patient Instructions (Addendum)
Target saturation is 90% and no need for higher. This should require 2lpm 24/7 except 5lpm with walking  No change on your pulmonary medications   Please schedule a follow up visit in 3 months but call sooner if needed

## 2013-02-27 NOTE — Progress Notes (Signed)
Subjective:    Patient ID: Michelle Hopkins, female    DOB: [birth year]  MRN: [id number]  HPI  47 yowf quit smoking age 79 with baseline advair/ spiriva  no need for 02 could do HT but [facility] then hosp at [facility] Jan 2013  And "not right since" with breathing   DATE OF ADMISSION: 05/27/2011  DATE OF DISCHARGE: 06/11/2011  DISCHARGE SUMMARY  MEDICATIONS ON DISCHARGE:  1. Augmentin 250/62.5 suspension 15 mL b.i.d. x6 days.  2. Biotin liquid rinse twice daily.  3. Diltiazem 30 mg p.o. q.6 hours.  4. Resource Breeze 1 container 3 times daily with meals.  5. Xopenex nebs q.4 hours as needed for wheezing or shortness of  breath.  6. Beta-carotene/Ocuvite 1 tablet by mouth daily.  7. Advair 1 inhalation twice daily, 250/50.  8. Levothyroxine 88 mcg daily.  9. Remeron 15 mg p.o. at bedtime.  10.Omeprazole 20 mg by mouth daily, open or crush.  11.Spiriva 1 inhalation 18 mcg once daily.  12.Diflucan 200 mg p.o. daily x2 weeks.  FINAL DIAGNOSES:  1. Aspiration pneumonia.  2. Megaesophagus with poor motility.  3. Need for chronic liquid feeding.  4. Chronic debilitation.  5. Atrial fibrillation, no longer a Coumadin candidate because of GI  issues and inconsistent feeding.  6. Atrial fibrillation with rapid ventricular rate, resolved.  7. Postherpetic neuralgia, stable.  8. Depression.  9. Insomnia.  10.Osteoporosis.  11.Hypertension.  12.History of dysphagia and weight loss secondary to above.   09/02/2011 1st pulmonary eval cc doe x 50 ft on 4lpm , not really bothered by cough. On advair but not spiriva.   rec Spiriva each am Only use your xopenex  as a rescue medication     10/21/2011 f/u ov/Michelle Hopkins on 4lpm 24 h cc sob better, able to walk 151f s stopping   hasn't needed any saba daytime, no cough, feels spiriva helping and swallowing has improved. Rec Be very careful with swallowing that you don't get things stuck or get them down the wrong way - if swallowing gets any worse  may need an alternative to cardizem like bisoprolol   01/21/2012 f/u ov/Michelle Hopkins  on 4lpm, walking sleeping sitting> no limiting sob but very sedentary. rec No change in medications Please see patient coordinator before you leave today  to schedule pulmonary rehab> did not go  04/06/2012 f/u ov/Michelle Hopkins cc doe no change activity tol but very sedatary, did not understand 02 rx (see a/p). rec 02 2lpm resting and sleeping but 4 lpm with any acitivity   07/04/2012 f/u ov/Michelle Hopkins cc no change doe on 02 only walking about 50 ft on it, really not limited by breathing as long as she wears it but want off.  rec Ok to take off 02 at rest and walking across the room but other wise wear 2lpm (for example at bedtime) and increase to 4lpm with exertion       12/22/2012 f/u ov/Michelle Hopkins re copd Chief Complaint  Patient presents with  . Follow-up    Pt states breathing unchnaged since her last visit. She states has no energy and appetite is poor.  She had hemoptysis x 2 wks ago, and Dr Ashok Pall txed her with zpack with helped.   rec No change rx   02/27/2013 f/u ov/Michelle Hopkins re: copd/ 02 dep  Chief Complaint  Patient presents with  . Follow-up    Pt states approx 6 wks ago, her o2 sats were too low on 4lpm, so she increased to 5lpm with  exertion. Her breathing has improved since this change    No obvious daytime variabilty or assoc  chest tightness, subjective wheeze overt sinus or hb symptoms. No unusual exp hx   Sleeping ok without nocturnal  or early am exacerbation  of respiratory  c/o's or need for noct saba. Also denies any obvious fluctuation of symptoms with weather or environmental changes or other aggravating or alleviating factors except as outlined above.  Current Medications, Allergies, Past Medical History, Past Surgical History, Family History, and Social History were reviewed in [employer] record.  ROS  The following are not active complaints unless bolded sore throat,  dysphagia, dental problems, itching, sneezing,  nasal congestion or excess/ purulent secretions, ear ache,   fever, chills, sweats, unintended wt loss, pleuritic or exertional cp, hemoptysis,  orthopnea pnd or leg swelling, presyncope, palpitations, heartburn, abdominal pain, anorexia, nausea, vomiting, diarrhea  or change in bowel or urinary habits, change in stools or urine, dysuria,hematuria,  rash, arthralgias, visual complaints, headache, numbness weakness or ataxia or problems with walking or coordination,  change in mood/affect or memory.                 Objective:   Physical Exam 09/02/2011  88 > 89 10/21/2011 > 01/21/2012  91 > 04/06/2012  94 > 90 07/04/2012 >  86 11/10/2012 > wt 02/27/2013 88  Elderly wf with ? choreathetoid movement disorder  HEENT mild turbinate edema.  Oropharynx no thrush or excess pnd or cobblestoning.  No JVD or cervical adenopathy. Mild accessory muscle hypertrophy. Trachea midline, nl thryroid. Chest was hyperinflated by percussion with diminished breath sounds and moderate increased exp time without wheeze. Hoover sign positive at mid inspiration. Regular rate and rhythm without murmur gallop or rub or increase P2 or edema.  Abd: no hsm, nl excursion. Ext warm without cyanosis or clubbing.     CXR  11/10/2012 :  COPD. No acute cardiopulmonary abnormality.       Assessment & Plan:

## 2013-02-28 NOTE — Assessment & Plan Note (Signed)
-   02 rx 24/7 since discharge 05/2011    - 01/21/2012   Walked 4lpm x one lap @ 185 stopped due to  Sob but sat still 94% so RX = 4 lpm with acitivty, 2lpm ok at rest/sleeping   - 07/04/2012   Walked RA x one half  lap or about 90 ft stopped due to  desat to 82% so ok to leave 02 off at rest and walking room to room   - 02/27/2013   Walked 5lpmx one lap @ 185 stopped due to  sats 80  Though sats mid 90s on 2lpm at rest  rx =   2lpm sleep and rest  4lpm walking more than room room, reviewed

## 2013-03-01 NOTE — Assessment & Plan Note (Signed)
-   Added back spiriva 09/02/2011    - PFT's 10/14/11  FEV1  0.65 (45%) ratio 42 and 12% better p B2, DLC0 30 corrects to 47%   - refer to pulmonary rehab 01/21/2012  > declined  She predominantly has emphysema with loss of DLCO reflected in desats with activity but really nothing else to offer at this point     Each maintenance medication was reviewed in detail including most importantly the difference between maintenance and as needed and under what circumstances the prns are to be used.  Please see instructions for details which were reviewed in writing and the patient given a copy.

## 2013-05-29 ENCOUNTER — Ambulatory Visit (INDEPENDENT_AMBULATORY_CARE_PROVIDER_SITE_OTHER): Payer: Medicare Other | Admitting: Internal Medicine

## 2013-05-29 ENCOUNTER — Encounter: Payer: Self-pay | Admitting: Internal Medicine

## 2013-05-29 VITALS — BP 160/90 | HR 91 | Temp 98.2°F | Ht 61.0 in | Wt 85.0 lb

## 2013-05-29 DIAGNOSIS — J9610 Chronic respiratory failure, unspecified whether with hypoxia or hypercapnia: Secondary | ICD-10-CM

## 2013-05-29 DIAGNOSIS — J449 Chronic obstructive pulmonary disease, unspecified: Secondary | ICD-10-CM

## 2013-05-29 NOTE — Progress Notes (Signed)
Subjective:    Patient ID: Ernestene KielRuenell Salamon, female    DOB: 06/16/1926  MRN: [id number]   Brief patient profile:   [age] yowf quit smoking age 80 with baseline advair/ spiriva  no need for 02 could do HT but used [facility] then hosp at [facility] Jan 2013  And "not right since" with breathing   DATE OF ADMISSION: 05/27/2011  DATE OF DISCHARGE: 06/11/2011  DISCHARGE SUMMARY  MEDICATIONS ON DISCHARGE:  1. Augmentin 250/62.5 suspension 15 mL b.i.d. x6 days.  2. Biotin liquid rinse twice daily.  3. Diltiazem 30 mg p.o. q.6 hours.  4. Resource Breeze 1 container 3 times daily with meals.  5. Xopenex nebs q.4 hours as needed for wheezing or shortness of  breath.  6. Beta-carotene/Ocuvite 1 tablet by mouth daily.  7. Advair 1 inhalation twice daily, 250/50.  8. Levothyroxine 88 mcg daily.  9. Remeron 15 mg p.o. at bedtime.  10.Omeprazole 20 mg by mouth daily, open or crush.  11.Spiriva 1 inhalation 18 mcg once daily.  12.Diflucan 200 mg p.o. daily x2 weeks.  FINAL DIAGNOSES:  1. Aspiration pneumonia.  2. Megaesophagus with poor motility.  3. Need for chronic liquid feeding.  4. Chronic debilitation.  5. Atrial fibrillation, no longer a Coumadin candidate because of GI  issues and inconsistent feeding.  6. Atrial fibrillation with rapid ventricular rate, resolved.  7. Postherpetic neuralgia, stable.  8. Depression.  9. Insomnia.  10.Osteoporosis.  11.Hypertension.  12.History of dysphagia and weight loss secondary to above.   09/02/2011 1st pulmonary eval cc doe x 50 ft on 4lpm , not really bothered by cough. On advair but not spiriva.   rec Spiriva each am Only use your xopenex  as a rescue medication    04/06/2012 f/u ov/Wert cc doe no change activity tol but very sedatary, did not understand 02 rx (see a/p). rec 02 2lpm resting and sleeping but 4 lpm with any acitivity     02/27/2013 f/u ov/Wert re: copd/ 02 dep  Chief Complaint  Patient presents with  . Follow-up    Pt states  approx 6 wks ago, her o2 sats were too low on 4lpm, so she increased to 5lpm with exertion. Her breathing has improved since this change  rec Target saturation is 90% and no need for higher. This should require 2lpm 24/7 except 5lpm with walking No change on your pulmonary medications    05/29/2013 f/u ov/Wert re: copd/02 dep Chief Complaint  Patient presents with  . Follow-up    Pt states her breathing is overall doing well and she denies any new co's today.   using 02 with 4lpm s adjustments Only able to walk 50 ft on 4lpm On spiriva and advair - no need for saba at all  No obvious day to day or daytime variabilty or assoc chronic cough or cp or chest tightness, subjective wheeze overt sinus or hb symptoms. No unusual exp hx or h/o childhood pna/ asthma or knowledge of premature birth.  Sleeping ok without nocturnal  or early am exacerbation  of respiratory  c/o's or need for noct saba. Also denies any obvious fluctuation of symptoms with weather or environmental changes or other aggravating or alleviating factors except as outlined above   Current Medications, Allergies, Complete Past Medical History, Past Surgical History, Family History, and Social History were reviewed in [facility] electronic medical record.  ROS  The following are not active complaints unless bolded sore throat, dysphagia, dental problems, itching, sneezing,  nasal  congestion or excess/ purulent secretions, ear ache,   fever, chills, sweats, unintended wt loss, pleuritic or exertional cp, hemoptysis,  orthopnea pnd or leg swelling, presyncope, palpitations, heartburn, abdominal pain, anorexia, nausea, vomiting, diarrhea  or change in bowel or urinary habits, change in stools or urine, dysuria,hematuria,  rash, arthralgias, visual complaints, headache, numbness weakness or ataxia or problems with walking or coordination,  change in mood/affect or memory.         No obvious daytime variabilty or assoc  chest  tightness, subjective wheeze overt sinus or hb symptoms. No unusual exp hx   Sleeping ok without nocturnal  or early am exacerbation  of respiratory  c/o's or need for noct saba. Also denies any obvious fluctuation of symptoms with weather or environmental changes or other aggravating or alleviating factors except as outlined above.  Current Medications, Allergies, Past Medical History, Past Surgical History, Family History, and Social History were reviewed in [facility] record.  ROS  The following are not active complaints unless bolded sore throat, dysphagia, dental problems, itching, sneezing,  nasal congestion or excess/ purulent secretions, ear ache,   fever, chills, sweats, unintended wt loss, pleuritic or exertional cp, hemoptysis,  orthopnea pnd or leg swelling, presyncope, palpitations, heartburn, abdominal pain, anorexia, nausea, vomiting, diarrhea  or change in bowel or urinary habits, change in stools or urine, dysuria,hematuria,  rash, arthralgias, visual complaints, headache, numbness weakness or ataxia or problems with walking or coordination,  change in mood/affect or memory.                 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  Oropharynx no thrush or excess pnd or cobblestoning.  No JVD or cervical adenopathy. Mild accessory muscle hypertrophy. Trachea midline, nl thryroid. Chest was hyperinflated by percussion with diminished breath sounds and moderate increased exp time without wheeze. Hoover sign positive at mid inspiration. Regular rate and rhythm without murmur gallop or rub or increase P2 or edema.  Abd: no hsm, nl excursion. Ext warm without cyanosis or clubbing.     CXR  11/10/2012 :  COPD. No acute cardiopulmonary abnormality.          Assessment & Plan:

## 2013-05-29 NOTE — Assessment & Plan Note (Signed)
-   Added back spiriva 09/02/2011    - PFT's 10/14/11  FEV1  0.65 (45%) ratio 42 and 12% better p B2, DLC0 30 corrects to 47%   - refer to pulmonary rehab 01/21/2012  > declined  Adequate control on present rx, reviewed > no change in rx needed

## 2013-05-29 NOTE — Patient Instructions (Addendum)
Ideally we need to keep your oxygen level above 90% and fine to adjust the flow of 02 to the lowest level that achieves that amount.  Continue advair and spiriva  Only use your levoalbuterol (xopenex) as a rescue medication to be used if you can't catch your breath by resting or doing a relaxed purse lip breathing pattern. The less you use it, the better it will work when you need it. Ok to use to up every 4 hours if needed if you feel it helps do more - if so please make an appt to see my NP Tammy to get you a longer acting form of albuterol for your neb  Please schedule a follow up visit in 3 months but call sooner if needed

## 2013-05-29 NOTE — Assessment & Plan Note (Signed)
-   02 rx 24/7 since discharge 05/2011 with p pna with  CT with contrast 05/27/11 1. Multifocal air space opacities in both lungs most consistent  with multilobar pneumonia.    - 01/21/2012   Walked 4lpm x one lap @ 185 stopped due to  Sob but sat still 94% so RX = 4 lpm with acitivty, 2lpm ok at rest/sleeping   - 07/04/2012   Walked RA x one half  lap or about 90 ft stopped due to  desat to 82% so ok to leave 02 off at rest and walking room to room   - 02/27/2013   Walked 5lpmx one lap @ 185 stopped due to  sats 80  Though sats mid 90s on 2lpm at rest   - 05/29/2013   Walked 4lpm x 50 ft @ 185 stopped due to  desats improved on 5lpm   rx =   rx  4lpm 24/7 increase to 5lpm with activity as of 05/29/2013   Most likely this is a result of copd/e with ali from pna 2013 but nothing further to add to rx at this point x for precautions with swallowing ? Developing more severe movement disorder that may ultimately compromise swallowing/ coughing.

## 2013-06-20 ENCOUNTER — Telehealth: Payer: Self-pay | Admitting: Internal Medicine

## 2013-06-20 MED ORDER — TIOTROPIUM BROMIDE MONOHYDRATE 18 MCG IN CAPS: 18.0000 ug | ORAL_CAPSULE | Freq: Every day | RESPIRATORY_TRACT | Status: AC

## 2013-06-20 NOTE — Telephone Encounter (Signed)
Pt requesting written rx for Spiriva to send to pt assistance.  Rx printed and placed for Dr Sherene SiresWert to sign.  Place at front desk for pick up when complete.

## 2013-07-19 ENCOUNTER — Telehealth: Payer: Self-pay | Admitting: Internal Medicine

## 2013-07-19 NOTE — Telephone Encounter (Signed)
Samples up front for pt.  Pt aware.  Nothing further needed.

## 2013-10-27 ENCOUNTER — Ambulatory Visit (INDEPENDENT_AMBULATORY_CARE_PROVIDER_SITE_OTHER)
Admission: RE | Admit: 2013-10-27 | Discharge: 2013-10-27 | Disposition: A | Discharge status: Home / Self Care | Payer: Medicare Other | Source: Ambulatory Visit | Attending: Internal Medicine | Admitting: Internal Medicine

## 2013-10-27 ENCOUNTER — Ambulatory Visit (INDEPENDENT_AMBULATORY_CARE_PROVIDER_SITE_OTHER): Payer: Medicare Other | Admitting: Internal Medicine

## 2013-10-27 ENCOUNTER — Encounter: Payer: Self-pay | Admitting: Internal Medicine

## 2013-10-27 VITALS — BP 150/90 | HR 90 | Temp 98.3°F | Ht 63.0 in | Wt 83.0 lb

## 2013-10-27 DIAGNOSIS — J9610 Chronic respiratory failure, unspecified whether with hypoxia or hypercapnia: Secondary | ICD-10-CM

## 2013-10-27 DIAGNOSIS — J449 Chronic obstructive pulmonary disease, unspecified: Secondary | ICD-10-CM

## 2013-10-27 MED ORDER — PREDNISONE 10 MG PO TABS: ORAL_TABLET | ORAL | Status: DC

## 2013-10-27 NOTE — Patient Instructions (Addendum)
Ideally we need to keep your oxygen level above 90% and fine to adjust the flow of 02 to the lowest level that achieves that amount.  Continue advair and spiriva.  Only use your levoalbuterol (xopenex) as a rescue medication to be used if you can't catch your breath by resting or doing a relaxed purse lip breathing pattern. The less you use it, the better it will work when you need it. Ok to use to up every 4 hours if needed   Please remember to go to the  x-ray department downstairs for your tests - we will call you with the results when they are available.  Prednisone 10 mg take  4 each am x 2 days,  2 each am x 2 days,  1 each am x 2 days and stop     Please schedule a follow up visit in 3 months but call sooner if needed

## 2013-10-27 NOTE — Assessment & Plan Note (Signed)
-   Added back spiriva 09/02/2011    - PFT's 10/14/11  FEV1  0.65 (45%) ratio 42 and 12% better p B2, DLC0 30 corrects to 47%   - refer to pulmonary rehab 01/21/2012  > declined   Mild flare with trace wheeze > Prednisone 10 mg take  4 each am x 2 days,   2 each am x 2 days,  1 each am x 2 days and stop

## 2013-10-27 NOTE — Assessment & Plan Note (Signed)
-   02 rx 24/7 since discharge 05/2011 with p pna with  CT with contrast 05/27/11 1. Multifocal air space opacities in both lungs most consistent  with multilobar pneumonia.    - 01/21/2012   Walked 4lpm x one lap @ 185 stopped due to  Sob but sat still 94% so RX = 4 lpm with acitivty, 2lpm ok at rest/sleeping   - 07/04/2012   Walked RA x one half  lap or about 90 ft stopped due to  desat to 82% so ok to leave 02 off at rest and walking room to room   - 02/27/2013   Walked 5lpmx one lap @ 185 stopped due to  sats 80  Though sats mid 90s on 2lpm at rest   - 05/29/2013   Walked 4lpm x 50 ft @ 185 stopped due to  desats improved on 5lpm    rx = self titrate to maintain > 90% 10/27/2013

## 2013-10-27 NOTE — Progress Notes (Signed)
Quick Note:  Spoke with pt and notified of results per Dr. Wert. Pt verbalized understanding and denied any questions.  ______ 

## 2013-10-27 NOTE — Progress Notes (Signed)
Subjective:    Patient ID: Michelle KielRuenell Fontanilla, female    DOB: 08/09/1926  MRN: [id number]   Brief patient profile:   [age] yowf quit smoking age 80 with baseline advair/ spiriva  no need for 02 could do HT but [facility] then hosp at [facility] Jan 2013  And "not right since" with breathing with GOLD III COPD docmented 10/14/11   DATE OF ADMISSION: 05/27/2011  DATE OF DISCHARGE: 06/11/2011  DISCHARGE SUMMARY  MEDICATIONS ON DISCHARGE:  1. Augmentin 250/62.5 suspension 15 mL b.i.d. x6 days.  2. Biotin liquid rinse twice daily.  3. Diltiazem 30 mg p.o. q.6 hours.  4. Resource Breeze 1 container 3 times daily with meals.  5. Xopenex nebs q.4 hours as needed for wheezing or shortness of  breath.  6. Beta-carotene/Ocuvite 1 tablet by mouth daily.  7. Advair 1 inhalation twice daily, 250/50.  8. Levothyroxine 88 mcg daily.  9. Remeron 15 mg p.o. at bedtime.  10.Omeprazole 20 mg by mouth daily, open or crush.  11.Spiriva 1 inhalation 18 mcg once daily.  12.Diflucan 200 mg p.o. daily x2 weeks.  FINAL DIAGNOSES:  1. Aspiration pneumonia.  2. Megaesophagus with poor motility.  3. Need for chronic liquid feeding.  4. Chronic debilitation.  5. Atrial fibrillation, no longer a Coumadin candidate because of GI  issues and inconsistent feeding.  6. Atrial fibrillation with rapid ventricular rate, resolved.  7. Postherpetic neuralgia, stable.  8. Depression.  9. Insomnia.  10.Osteoporosis.  11.Hypertension.  12.History of dysphagia and weight loss secondary to above.   09/02/2011 1st pulmonary eval cc doe x 50 ft on 4lpm , not really bothered by cough. On advair but not spiriva.   rec Spiriva each am Only use your xopenex  as a rescue medication    04/06/2012 f/u ov/Shem Plemmons cc doe no change activity tol but very sedatary, did not understand 02 rx (see a/p). rec 02 2lpm resting and sleeping but 4 lpm with any acitivity     02/27/2013 f/u ov/Ercell Razon re: copd/ 02 dep  Chief Complaint  Patient  presents with  . Follow-up    Pt states approx 6 wks ago, her o2 sats were too low on 4lpm, so she increased to 5lpm with exertion. Her breathing has improved since this change  rec Target saturation is 90% and no need for higher. This should require 2lpm 24/7 except 5lpm with walking No change on your pulmonary medications    05/29/2013 f/u ov/Charon Smedberg re: copd/02 dep Chief Complaint  Patient presents with  . Follow-up    Pt states her breathing is overall doing well and she denies any new co's today.   using 02 with 4lpm s adjustments Only able to walk 50 ft on 4lpm On spiriva and advair - no need for saba at all rec Ideally we need to keep your oxygen level above 90% and fine to adjust the flow of 02 to the lowest level that achieves that amount.  Continue advair and spiriva Only use your levoalbuterol (xopenex) as a rescue medication to be used if you can't catch your breath by resting or doing a relaxed purse lip breathing pattern. The less you use it, the better it will work when you need it    10/27/2013 f/u ov/Chidinma Clites re: GOLD III COPD/ 02 dep  Chief Complaint  Patient presents with  . Follow-up    Pt states that her breathing is about the same. She c/o non prod cough x 2 wks- worse after eating and  states that her food"goes down the wrong way"      No obvious day to day or daytime variabilty or assoc chronic cough or excess mucus production  or cp or chest tightness, subjective wheeze overt sinus or hb symptoms. No unusual exp hx or h/o childhood pna/ asthma or knowledge of premature birth.  Sleeping ok without nocturnal  or early am exacerbation  of respiratory  c/o's or need for noct saba. Also denies any obvious fluctuation of symptoms with weather or environmental changes or other aggravating or alleviating factors except as outlined above   Current Medications, Allergies, Complete Past Medical History, Past Surgical History, Family History, and Social History were reviewed in  [employer] record.  ROS  The following are not active complaints unless bolded sore throat, dysphagia, dental problems, itching, sneezing,  nasal congestion or excess/ purulent secretions, ear ache,   fever, chills, sweats, unintended wt loss, pleuritic or exertional cp, hemoptysis,  orthopnea pnd or leg swelling, presyncope, palpitations, heartburn, abdominal pain, anorexia, nausea, vomiting, diarrhea  or change in bowel or urinary habits, change in stools or urine, dysuria,hematuria,  rash, arthralgias, visual complaints, headache, numbness weakness or ataxia or problems with walking or coordination,  change in mood/affect or memory.             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  Oropharynx no thrush or excess pnd or cobblestoning.  No JVD or cervical adenopathy. Mild accessory muscle hypertrophy. Trachea midline, nl thryroid. Chest was hyperinflated by percussion with diminished breath sounds and moderate increased exp time with trace mid exp bilateral wheeze. Hoover sign positive at mid inspiration. Regular rate and rhythm without murmur gallop or rub or increase P2 or edema.  Abd: no hsm, nl excursion. Ext warm without cyanosis or clubbing.     CXR  11/10/2012 :  COPD. No acute cardiopulmonary abnormality.         Assessment & Plan:

## 2013-11-08 ENCOUNTER — Telehealth: Payer: Self-pay | Admitting: Internal Medicine

## 2013-11-08 DIAGNOSIS — J449 Chronic obstructive pulmonary disease, unspecified: Secondary | ICD-10-CM

## 2013-11-08 MED ORDER — AMOXICILLIN-POT CLAVULANATE 875-125 MG PO TABS: 1.0000 | ORAL_TABLET | Freq: Two times a day (BID) | ORAL | Status: DC

## 2013-11-08 MED ORDER — PREDNISONE 10 MG PO TABS: ORAL_TABLET | ORAL | Status: DC

## 2013-11-08 NOTE — Telephone Encounter (Signed)
10/27/13: Patient Instructions       Ideally we need to keep your oxygen level above 90% and fine to adjust the flow of 02 to the lowest level that achieves that amount.   Continue advair and spiriva. Only use your levoalbuterol (xopenex) as a rescue medication to be used if you can't catch your breath by resting or doing a relaxed purse lip breathing pattern. The less you use it, the better it will work when you need it. Ok to use to up every 4 hours if needed  Please remember to go to the  x-ray department downstairs for your tests - we will call you with the results when they are available. Prednisone 10 mg take  4 each am x 2 days,  2 each am x 2 days,  1 each am x 2 days and stop   Please schedule a follow up visit in 3 months but call sooner if needed  --  I called spoke with pt. Requesting another round of ABX. She reports she is still coughing up white phlem, slight increase SOB. No wheezing, no chest tx. Please advise MW thanks  Allergies  Allergen Reactions  . Canarium     Unknown reaction  . Codeine   . Latex     Rash   . Neosporin [Neomycin-Polymyxin B Gu]   . Rice   . Tape     Rips skin

## 2013-11-08 NOTE — Telephone Encounter (Signed)
Augmentin 875 mg take one pill twice daily  X 10 days - take at breakfast and supper with large glass of water.  It would help reduce the usual side effects (diarrhea and yeast infections) if you ate cultured yogurt at lunch.   Prednisone Take 4 for two days three for two days two for two days one for two days  (#20)

## 2013-11-08 NOTE — Telephone Encounter (Signed)
I called pt aware of recs. rx called in. Nothing furthr needed

## 2013-12-28 ENCOUNTER — Telehealth: Payer: Self-pay | Admitting: Internal Medicine

## 2013-12-28 DIAGNOSIS — J449 Chronic obstructive pulmonary disease, unspecified: Secondary | ICD-10-CM

## 2013-12-28 MED ORDER — DOXYCYCLINE HYCLATE 100 MG PO CAPS: 100.0000 mg | ORAL_CAPSULE | Freq: Two times a day (BID) | ORAL | Status: AC

## 2013-12-28 MED ORDER — PREDNISONE 10 MG PO TABS: ORAL_TABLET | ORAL | Status: DC

## 2013-12-28 NOTE — Telephone Encounter (Signed)
Give doxyxycline 100 bid x 7 days, pred taper > Take 40mg  daily for 3 days, then 30mg  daily for 3 days, then 20mg  daily for 3 days, then 10mg  daily for 3 days, then stop

## 2013-12-28 NOTE — Telephone Encounter (Signed)
Patient is calling prednisone and abx.   [facility] [address]

## 2013-12-28 NOTE — Telephone Encounter (Signed)
Pt's nurse is returning call.  States she leaves at 2:00 today & needs this taken care of ASAP.  Michelle FairyHolly D Pryor

## 2013-12-28 NOTE — Telephone Encounter (Signed)
Last OV 10-27-13, MW pt. I spoke with the pt and she is c/o having increased productive cough with yellow phlegm, she states she is coughing more often and the phlegm is thicker than usual. She also c/o increased SOB and chest tightness. She denies any wheezing or chest tightness. Pt is requesting an rx for antibiotics and prednisone. She states she has issues with transportation and cannot come in for ov. Please advise. Carron CurieJennifer Castillo, CMA Allergies  Allergen Reactions  . Canarium     Unknown reaction  . Codeine   . Latex     Rash   . Neosporin [Neomycin-Polymyxin B Gu]   . Rice   . Tape     Rips skin

## 2013-12-28 NOTE — Telephone Encounter (Signed)
Pt was given pred taper at last OV, she is not on daily pred according to chart. ATC pt to discuss the need for prednisone because pt nurse is off for the rest of the day. Pt line was busy.WCB. Carron CurieJennifer Marianita Botkin, CMA

## 2013-12-28 NOTE — Telephone Encounter (Signed)
Called and spoke with pt and she is aware of RB recs for these medications and pt is aware that these meds have been sent to the pharmacy.  Pt will have her nurse go by in the morning to pick these up.  Nothing further is needed.

## 2014-01-01 ENCOUNTER — Emergency Department (HOSPITAL_COMMUNITY)
Admission: EM | Admit: 2014-01-01 | Discharge: 2014-01-01 | Disposition: A | Discharge status: Home / Self Care | Payer: Medicare Other | Attending: Internal Medicine | Admitting: Internal Medicine

## 2014-01-01 ENCOUNTER — Emergency Department (HOSPITAL_COMMUNITY): Payer: Medicare Other

## 2014-01-01 ENCOUNTER — Encounter (HOSPITAL_COMMUNITY): Payer: Self-pay | Admitting: Emergency Medicine

## 2014-01-01 DIAGNOSIS — J189 Pneumonia, unspecified organism: Secondary | ICD-10-CM | POA: Diagnosis present

## 2014-01-01 DIAGNOSIS — J181 Lobar pneumonia, unspecified organism: Secondary | ICD-10-CM

## 2014-01-01 DIAGNOSIS — E559 Vitamin D deficiency, unspecified: Secondary | ICD-10-CM | POA: Diagnosis not present

## 2014-01-01 DIAGNOSIS — M810 Age-related osteoporosis without current pathological fracture: Secondary | ICD-10-CM | POA: Insufficient documentation

## 2014-01-01 DIAGNOSIS — J441 Chronic obstructive pulmonary disease with (acute) exacerbation: Secondary | ICD-10-CM | POA: Diagnosis not present

## 2014-01-01 DIAGNOSIS — M329 Systemic lupus erythematosus, unspecified: Secondary | ICD-10-CM | POA: Diagnosis not present

## 2014-01-01 DIAGNOSIS — E039 Hypothyroidism, unspecified: Secondary | ICD-10-CM | POA: Diagnosis not present

## 2014-01-01 DIAGNOSIS — R0602 Shortness of breath: Secondary | ICD-10-CM | POA: Diagnosis present

## 2014-01-01 DIAGNOSIS — Z91040 Latex allergy status: Secondary | ICD-10-CM | POA: Diagnosis not present

## 2014-01-01 DIAGNOSIS — I10 Essential (primary) hypertension: Secondary | ICD-10-CM | POA: Diagnosis not present

## 2014-01-01 DIAGNOSIS — J449 Chronic obstructive pulmonary disease, unspecified: Secondary | ICD-10-CM

## 2014-01-01 DIAGNOSIS — Z87891 Personal history of nicotine dependence: Secondary | ICD-10-CM | POA: Insufficient documentation

## 2014-01-01 LAB — CBC WITH DIFFERENTIAL/PLATELET
Basophils Absolute: 0 10*3/uL (ref 0.0–0.1)
Basophils Relative: 0 % (ref 0–1)
EOS ABS: 0 10*3/uL (ref 0.0–0.7)
EOS PCT: 0 % (ref 0–5)
HEMATOCRIT: 39.1 % (ref 36.0–46.0)
Hemoglobin: 12.3 g/dL (ref 12.0–15.0)
LYMPHS ABS: 1.2 10*3/uL (ref 0.7–4.0)
Lymphocytes Relative: 9 % — ABNORMAL LOW (ref 12–46)
MCH: 24.7 pg — AB (ref 26.0–34.0)
MCHC: 31.5 g/dL (ref 30.0–36.0)
MCV: 78.5 fL (ref 78.0–100.0)
MONO ABS: 1.1 10*3/uL — AB (ref 0.1–1.0)
MONOS PCT: 8 % (ref 3–12)
Neutro Abs: 12 10*3/uL — ABNORMAL HIGH (ref 1.7–7.7)
Neutrophils Relative %: 83 % — ABNORMAL HIGH (ref 43–77)
PLATELETS: 220 10*3/uL (ref 150–400)
RBC: 4.98 MIL/uL (ref 3.87–5.11)
RDW: 17.8 % — ABNORMAL HIGH (ref 11.5–15.5)
WBC: 14.4 10*3/uL — ABNORMAL HIGH (ref 4.0–10.5)

## 2014-01-01 LAB — COMPREHENSIVE METABOLIC PANEL
ALT: 24 U/L (ref 0–35)
ANION GAP: 13 (ref 5–15)
AST: 24 U/L (ref 0–37)
Albumin: 3.4 g/dL — ABNORMAL LOW (ref 3.5–5.2)
Alkaline Phosphatase: 118 U/L — ABNORMAL HIGH (ref 39–117)
BUN: 21 mg/dL (ref 6–23)
CALCIUM: 9.6 mg/dL (ref 8.4–10.5)
CO2: 27 meq/L (ref 19–32)
CREATININE: 0.61 mg/dL (ref 0.50–1.10)
Chloride: 94 mEq/L — ABNORMAL LOW (ref 96–112)
GFR, EST NON AFRICAN AMERICAN: 80 mL/min — AB (ref >90)
GLUCOSE: 103 mg/dL — AB (ref 70–99)
Potassium: 3.9 mEq/L (ref 3.7–5.3)
SODIUM: 134 meq/L — AB (ref 137–147)
TOTAL PROTEIN: 6.7 g/dL (ref 6.0–8.3)
Total Bilirubin: 0.4 mg/dL (ref 0.3–1.2)

## 2014-01-01 LAB — D-DIMER, QUANTITATIVE: D-Dimer, Quant: 3.09 ug/mL-FEU — ABNORMAL HIGH (ref 0.00–0.48)

## 2014-01-01 LAB — PRO B NATRIURETIC PEPTIDE: Pro B Natriuretic peptide (BNP): 2248 pg/mL — ABNORMAL HIGH (ref 0–450)

## 2014-01-01 LAB — TROPONIN I

## 2014-01-01 LAB — I-STAT CG4 LACTIC ACID, ED: Lactic Acid, Venous: 1.76 mmol/L (ref 0.5–2.2)

## 2014-01-01 MED ORDER — LEVOFLOXACIN IN D5W 750 MG/150ML IV SOLN
750.0000 mg | Freq: Once | INTRAVENOUS | Status: AC
  Administered 2014-01-01: 750 mg via INTRAVENOUS
  Filled 2014-01-01: qty 150

## 2014-01-01 MED ORDER — METHYLPREDNISOLONE SODIUM SUCC 125 MG IJ SOLR
125.0000 mg | Freq: Once | INTRAMUSCULAR | Status: AC
  Administered 2014-01-01: 125 mg via INTRAVENOUS
  Filled 2014-01-01: qty 2

## 2014-01-01 MED ORDER — IPRATROPIUM BROMIDE 0.02 % IN SOLN
0.5000 mg | Freq: Once | RESPIRATORY_TRACT | Status: AC
  Administered 2014-01-01: 0.5 mg via RESPIRATORY_TRACT
  Filled 2014-01-01: qty 2.5

## 2014-01-01 MED ORDER — PREDNISONE 20 MG PO TABS: 40.0000 mg | ORAL_TABLET | Freq: Every day | ORAL | Status: AC

## 2014-01-01 MED ORDER — ALBUTEROL SULFATE (2.5 MG/3ML) 0.083% IN NEBU
5.0000 mg | INHALATION_SOLUTION | Freq: Once | RESPIRATORY_TRACT | Status: AC
  Administered 2014-01-01: 5 mg via RESPIRATORY_TRACT
  Filled 2014-01-01: qty 6

## 2014-01-01 MED ORDER — PIPERACILLIN-TAZOBACTAM 3.375 G IVPB 30 MIN: 3.3750 g | INTRAVENOUS | Status: DC

## 2014-01-01 MED ORDER — METHYLPREDNISOLONE SODIUM SUCC 40 MG IJ SOLR
40.0000 mg | Freq: Two times a day (BID) | INTRAMUSCULAR | Status: DC
  Administered 2014-01-01: 40 mg via INTRAVENOUS
  Filled 2014-01-01: qty 1

## 2014-01-01 MED ORDER — ALBUTEROL SULFATE 1.25 MG/3ML IN NEBU: 1.0000 | INHALATION_SOLUTION | Freq: Four times a day (QID) | RESPIRATORY_TRACT | Status: AC | PRN

## 2014-01-01 MED ORDER — LEVOFLOXACIN 500 MG PO TABS: 500.0000 mg | ORAL_TABLET | Freq: Every day | ORAL | Status: AC

## 2014-01-01 MED ORDER — VANCOMYCIN HCL IN DEXTROSE 750-5 MG/150ML-% IV SOLN
750.0000 mg | INTRAVENOUS | Status: DC
  Filled 2014-01-01: qty 150

## 2014-01-01 MED ORDER — SODIUM CHLORIDE 0.9 % IV SOLN
INTRAVENOUS | Status: DC
  Administered 2014-01-01: 10 mL/h via INTRAVENOUS

## 2014-01-01 NOTE — Consult Note (Signed)
Name: Michelle Hopkins MRN: [id number] DOB: 08/01/1926    ADMISSION DATE:  01/01/2014 CONSULTATION DATE:  01/01/2014  REFERRING MD :  EDP PRIMARY SERVICE:  TRH  CHIEF COMPLAINT:  SOB  BRIEF PATIENT DESCRIPTION: 80 year old previously DNR patient who was treated with abx one weak prior to admission for ? Of PNA after an episode of aspiration.  On day of presentation patient reported cough and SOB that has been none productive.  No fever, no chills, no chest pain, onset is not sudden but progressive and no hemoptysis.  No leg edema.  In the ED, CXR showed LLL PNA, severe COPD, elevated d-dimer and hypoxemia.  SIGNIFICANT EVENTS / STUDIES:  8/10 admission for SOB and cough.  LINES / TUBES: PIV  CULTURES: Blood 810>>> Urine 810>>> Sputum 810>>>  ANTIBIOTICS: Vancomycin 810>>> Zosyn 810>>>  PAST MEDICAL HISTORY :  Past Medical History  Diagnosis Date  . COPD (chronic obstructive pulmonary disease)   . HTN (hypertension)   . Hypothyroidism   . Osteoporosis   . Postherpetic neuralgia   . Aspiration pneumonia Jan. 2013  . Candida esophagitis Jan. 2013  . Lupus   . PAF (paroxysmal atrial fibrillation)   . Vitamin D deficiency   . Acute on chronic diastolic heart failure   . Esophageal dysmotility    Past Surgical History  Procedure Laterality Date  . Appendectomy  1996  . Left hip repair s/p fracture  2004  . Esophagogastroduodenoscopy  06/09/2011    Procedure: ESOPHAGOGASTRODUODENOSCOPY (EGD);  Surgeon: Iva Booparl E Gessner, MD;  [facility];  Service: Endoscopy;  Laterality: N/A;   Prior to Admission medications   Medication Sig Start Date End Date Taking? Authorizing Provider  beta carotene w/minerals (OCUVITE) tablet Take 1 tablet by mouth daily.     Yes Historical Provider, MD  diltiazem (CARDIZEM) 30 MG tablet Take 30 mg by mouth every 6 (six) hours.   Yes Historical Provider, MD  doxycycline (VIBRAMYCIN) 100 MG capsule Take 1 capsule (100 mg total) by mouth 2  (two) times daily. 12/28/13  Yes Leslye Peerobert S Byrum, MD  Fluticasone-Salmeterol (ADVAIR) 250-50 MCG/DOSE AEPB Inhale 1 puff into the lungs daily.     Yes Historical Provider, MD  levothyroxine (SYNTHROID, LEVOTHROID) 88 MCG tablet Take 88 mcg by mouth daily.     Yes Historical Provider, MD  mirtazapine (REMERON) 15 MG tablet Take 15 mg by mouth at bedtime.     Yes Historical Provider, MD  omeprazole (PRILOSEC) 20 MG capsule Take 20 mg by mouth daily.     Yes Historical Provider, MD  tiotropium (SPIRIVA) 18 MCG inhalation capsule Place 1 capsule (18 mcg total) into inhaler and inhale daily. 06/20/13  Yes Nyoka CowdenMichael B Wert, MD  levalbuterol Pauline Aus(XOPENEX) 0.63 MG/3ML nebulizer solution Take 3 mLs (0.63 mg total) by nebulization every 4 (four) hours as needed for wheezing or shortness of breath. 06/11/11 10/27/13  Gaspar Garbeichard W Tisovec, MD   Allergies  Allergen Reactions  . Canarium     Unknown reaction  . Codeine   . Latex     Rash   . Neosporin [Neomycin-Polymyxin B Gu]   . Rice   . Tape     Rips skin    FAMILY HISTORY:  Family History  Problem Relation Age of Onset  . Colon cancer Father   . Huntington's disease Mother   . Coronary artery disease Brother   . Melanoma Sister    SOCIAL HISTORY:  reports that she quit smoking about 34 years  ago. Her smoking use included Cigarettes. She has a 20 pack-year smoking history. She has never used smokeless tobacco. She reports that she does not drink alcohol or use illicit drugs.  REVIEW OF SYSTEMS:   Constitutional: Negative for fever, chills, weight loss, malaise/fatigue and diaphoresis.  HENT: Negative for hearing loss, ear pain, nosebleeds, congestion, sore throat, neck pain, tinnitus and ear discharge.   Eyes: Negative for blurred vision, double vision, photophobia, pain, discharge and redness.  Respiratory: Negative for cough, hemoptysis, sputum production, shortness of breath, wheezing and stridor.   Cardiovascular: Negative for chest pain,  palpitations, orthopnea, claudication, leg swelling and PND.  Gastrointestinal: Negative for heartburn, nausea, vomiting, abdominal pain, diarrhea, constipation, blood in stool and melena.  Genitourinary: Negative for dysuria, urgency, frequency, hematuria and flank pain.  Musculoskeletal: Negative for myalgias, back pain, joint pain and falls.  Skin: Negative for itching and rash.  Neurological: Negative for dizziness, tingling, tremors, sensory change, speech change, focal weakness, seizures, loss of consciousness, weakness and headaches.  Endo/Heme/Allergies: Negative for environmental allergies and polydipsia. Does not bruise/bleed easily.  SUBJECTIVE:   VITAL SIGNS:    PHYSICAL EXAMINATION: General:  Chronically ill appearing female, minimal respiratory distress. Neuro:  Alert and oriented, following commands. HEENT:  [place]/AT, PERRL, EOM-I and MMM. Cardiovascular:  RRR, Nl S1/S2, -M/R/G. Lungs:  Diffuse end exp wheezes, bibasilar crackles. Abdomen:  Soft, NT, ND and +BS. Musculoskeletal:  -edema and -tenderness. Skin:  Intact.   Recent Labs Lab 01/01/14 1408  NA 134*  K 3.9  CL 94*  CO2 27  BUN 21  CREATININE 0.61  GLUCOSE 103*    Recent Labs Lab 01/01/14 1408  HGB 12.3  HCT 39.1  WBC 14.4*  PLT 220   Dg Chest Port 1 View  01/01/2014   CLINICAL DATA:  Chest pain.  Anxiety attack.  Possible pneumonia.  EXAM: PORTABLE CHEST - 1 VIEW  COMPARISON:  10/27/2013  FINDINGS: Hyperinflation/COPD. S-shaped thoracolumbar spine curvature. Remote right rib trauma.  Mild cardiomegaly with atherosclerosis in the transverse aorta. No pleural effusion or pneumothorax. Chronic volume loss and scarring at the left lung base. Suspicion of concurrent more lateral left base airspace disease.  IMPRESSION: COPD/chronic bronchitis with pleural parenchymal scarring at the left lung base. Cannot exclude superimposed left lower lobe airspace disease. Consider further evaluation with PA and lateral  radiographs.  Atherosclerosis.   Electronically Signed   By: Jeronimo Greaves M.D.   On: 01/01/2014 14:38    ASSESSMENT / PLAN:  80 year old female with history of COPD presenting to the hospital with SOB.  Likely a combination of PNA and COPD.  BNP is also very elevated.  Multi-factorial.  Recent treatment with abx as outpatient last week.  Plan: - Pan cultures. - Vanc and zosyn. - Would not treat the d-dimer, likely elevated due to infection. - Recommend low dose IV steroids for the wheezing. - 2D echo and keep fluid even for now. - PCCM will follow with you.  Alyson Reedy, M.D. [facility] Pulmonary/Critical Care Medicine. Pager: [phone]. After hours pager: [phone].   01/01/2014, 3:43 PM

## 2014-01-01 NOTE — ED Provider Notes (Signed)
CSN: [id number]     Arrival date & time 01/01/14  1404 History   First MD Initiated Contact with Patient 01/01/14 1411     No chief complaint on file.    (Consider location/radiation/quality/duration/timing/severity/associated sxs/prior Treatment) HPI Comments: Patient here with acute shortness of breath that began this morning. Patient has a history of COPD and is on 6 L of oxygen. Home health nurse noted that she was more dyspneic this morning. She has had a nonproductive cough without fever. No vomiting or diarrhea noted. She does complain of some mid sternal chest pain which is similar to when she had an esophageal impaction. Denies any orthopnea. No lower extremity edema. He denies any concern for at this time. Does hold anxious at this time Denies any abdominal pain. Symptoms have been persistent and no treatment used prior to arrival.  The history is provided by the patient, a relative and a caregiver.    Past Medical History  Diagnosis Date  . COPD (chronic obstructive pulmonary disease)   . HTN (hypertension)   . Hypothyroidism   . Osteoporosis   . Postherpetic neuralgia   . Aspiration pneumonia Jan. 2013  . Candida esophagitis Jan. 2013  . Lupus   . PAF (paroxysmal atrial fibrillation)   . Vitamin D deficiency   . Acute on chronic diastolic heart failure   . Esophageal dysmotility    Past Surgical History  Procedure Laterality Date  . Appendectomy  1996  . Left hip repair s/p fracture  2004  . Esophagogastroduodenoscopy  06/09/2011    Procedure: ESOPHAGOGASTRODUODENOSCOPY (EGD);  Surgeon: Iva Booparl E Gessner, MD;  [facility];  Service: Endoscopy;  Laterality: N/A;   Family History  Problem Relation Age of Onset  . Colon cancer Father   . Huntington's disease Mother   . Coronary artery disease Brother   . Melanoma Sister    History  Substance Use Topics  . Smoking status: Former Smoker -- 1.00 packs/day for 20 years    Types: Cigarettes    Quit date:  08/25/1979  . Smokeless tobacco: Never Used  . Alcohol Use: No   OB History   Grav Para Term Preterm Abortions TAB SAB Ect Mult Living                 Review of Systems  All other systems reviewed and are negative.     Allergies  Canarium; Codeine; Latex; Neosporin; Rice; and Tape  Home Medications   Prior to Admission medications   Medication Sig Start Date End Date Taking? Authorizing Provider  amoxicillin-clavulanate (AUGMENTIN) 875-125 MG per tablet Take 1 tablet by mouth 2 (two) times daily. 11/08/13   Nyoka CowdenMichael B Wert, MD  beta carotene w/minerals (OCUVITE) tablet Take 1 tablet by mouth daily.      Historical Provider, MD  diltiazem (CARDIZEM) 30 MG tablet TAKE 1 TABLET (30 MG TOTAL) BY MOUTH EVERY 6 (SIX) HOURS. 04/25/12   Duke SalviaSteven C Klein, MD  doxycycline (VIBRAMYCIN) 100 MG capsule Take 1 capsule (100 mg total) by mouth 2 (two) times daily. 12/28/13   Leslye Peerobert S Byrum, MD  feeding supplement (RESOURCE BREEZE) LIQD Take 1 Container by mouth 3 (three) times daily with meals. 06/11/11   Gaspar Garbeichard W Tisovec, MD  Fluticasone-Salmeterol (ADVAIR) 250-50 MCG/DOSE AEPB Inhale 1 puff into the lungs daily.      Historical Provider, MD  levalbuterol Pauline Aus(XOPENEX) 0.63 MG/3ML nebulizer solution Take 3 mLs (0.63 mg total) by nebulization every 4 (four) hours as needed for wheezing or  shortness of breath. 06/11/11 10/27/13  Gaspar Garbe, MD  levothyroxine (SYNTHROID, LEVOTHROID) 88 MCG tablet Take 88 mcg by mouth daily.      Historical Provider, MD  mirtazapine (REMERON) 15 MG tablet Take 15 mg by mouth at bedtime.      Historical Provider, MD  omeprazole (PRILOSEC) 20 MG capsule Take 20 mg by mouth daily.      Historical Provider, MD  predniSONE (DELTASONE) 10 MG tablet Take  4 each am x 3 days, 3 each am x 3 days,  2 each am x 3 days,  1 each am x 3 days and stop 12/28/13   Leslye Peer, MD  tiotropium (SPIRIVA) 18 MCG inhalation capsule Place 1 capsule (18 mcg total) into inhaler and inhale daily.  06/20/13   Nyoka Cowden, MD   There were no vitals taken for this visit. Physical Exam  Nursing note and vitals reviewed. Constitutional: She is oriented to person, place, and time. She appears cachectic. She appears toxic. She appears distressed.  HENT:  Head: Normocephalic and atraumatic.  Eyes: Conjunctivae, EOM and lids are normal. Pupils are equal, round, and reactive to light.  Neck: Normal range of motion. Neck supple. No tracheal deviation present. No mass present.  Cardiovascular: Regular rhythm and normal heart sounds.  Tachycardia present.  Exam reveals no gallop.   No murmur heard. Pulmonary/Chest: Accessory muscle usage present. No stridor. She is in respiratory distress. She has decreased breath sounds. She has wheezes. She has no rhonchi. She has no rales.  Abdominal: Soft. Normal appearance and bowel sounds are normal. She exhibits no distension. There is no tenderness. There is no rebound and no CVA tenderness.  Musculoskeletal: Normal range of motion. She exhibits no edema and no tenderness.  Neurological: She is alert and oriented to person, place, and time. She has normal strength. No cranial nerve deficit or sensory deficit. GCS eye subscore is 4. GCS verbal subscore is 5. GCS motor subscore is 6.  Skin: Skin is warm and dry. No abrasion and no rash noted.  Psychiatric: Her speech is normal and behavior is normal. Her mood appears anxious.    ED Course  Procedures (including critical care time) Labs Review Labs Reviewed  CBC WITH DIFFERENTIAL  COMPREHENSIVE METABOLIC PANEL  TROPONIN I  PRO B NATRIURETIC PEPTIDE  D-DIMER, QUANTITATIVE    Imaging Review No results found.   EKG Interpretation   Date/Time:  Monday January 01 2014 14:33:12 EDT Ventricular Rate:  93 PR Interval:    QRS Duration: 90 QT Interval:  398 QTC Calculation: 495 R Axis:   120 Text Interpretation:  Atrial fibrillation Lateral infarct, age  indeterminate Anteroseptal infarct, old  Confirmed by Westen Dinino  MD, Treyon Wymore  ([id number]) on 01/01/2014 3:51:21 PM      MDM   Final diagnoses:  None   Patient given albuterol with Atrovent here along with Solu-Medrol. She feels much better at this time. Spoke with patient's primary care Dr. who requested pulmonary consult for admission We'll be treated empirically for pneumonia. Spoke to pulmonary who recommended medicine admission. Spoke to triad hospitalist and patient to be admitted     Toy Baker, MD 01/01/14 [phone]

## 2014-01-01 NOTE — ED Provider Notes (Signed)
6:01 PM Pt seen by another ED provider and plan was for pt to be admitted. Since then shifts have changed and pt would now like to be discharged. I spoke with pt/family at length at bedside. I too feel she should be admitted. With her age, underlying COPD, tachycardia and increased WOB, discharge would be against my medical advice. She is adamant about going home though and I believe she has medical decision making capability. She received solumedrol and levaquin in ED. Will discharge with continued levaquin/steroids. Requesting albuterol neb refills. Says has machine already as well as a MDI. Understands the need to return for worsening symptoms, no improvement or anything concerning to her.   Raeford RazorStephen Ethelwyn Gilbertson, MD 01/01/14 [phone]

## 2014-01-01 NOTE — ED Notes (Signed)
Pt and pt's power of attorney state she does not want to be admitted to hospital. They are requesting to be discharged with antibiotics. Also requesting to speak to doctor. Dr Izola PriceMyers paged.

## 2014-01-01 NOTE — ED Notes (Signed)
[facility] that they have been unable to get a consistent oxygen reading on pt, but the moments that it was picking up well O2 was 93-95% on 6 L [place].

## 2014-01-01 NOTE — ED Notes (Signed)
Been trying to get EKG machine to work correct, nurse is trying to get it done now.

## 2014-01-01 NOTE — ED Notes (Signed)
Pt brought straight to room. Pt very short of breath. MD Dr. Freida BusmanAllen notified.

## 2014-01-01 NOTE — ED Notes (Signed)
Per nursing at the bedside the patient has been short of breath and cough all day. She started to cough after swollowing a pill earlier however has been treated for the last week on antibiotics for the same cough. The patient is extremely anxious and is in constant motion. The patinet also coughing frequently clear frothy sputum.

## 2014-01-01 NOTE — Discharge Instructions (Signed)

## 2014-01-01 NOTE — Progress Notes (Signed)
ANTIBIOTIC CONSULT NOTE - INITIAL  Pharmacy Consult for vancomycin/zosyn Indication: pneumonia  Allergies  Allergen Reactions  . Canarium     Unknown reaction  . Codeine   . Latex     Rash   . Neosporin [Neomycin-Polymyxin B Gu]   . Rice   . Tape     Rips skin    Patient Measurements:   Adjusted Body Weight:   Vital Signs:   Intake/Output from previous day:   Intake/Output from this shift:    Labs:  Recent Labs  01/01/14 1408  WBC 14.4*  HGB 12.3  PLT 220  CREATININE 0.61   The CrCl is unknown because both a height and weight (above a minimum accepted value) are required for this calculation. No results found for this basename: VANCOTROUGH, VANCOPEAK, VANCORANDOM, GENTTROUGH, GENTPEAK, GENTRANDOM, TOBRATROUGH, TOBRAPEAK, TOBRARND, AMIKACINPEAK, AMIKACINTROU, AMIKACIN,  in the last 72 hours   Microbiology: No results found for this or any previous visit (from the past 720 hour(s)).  Medical History: Past Medical History  Diagnosis Date  . COPD (chronic obstructive pulmonary disease)   . HTN (hypertension)   . Hypothyroidism   . Osteoporosis   . Postherpetic neuralgia   . Aspiration pneumonia Jan. 2013  . Candida esophagitis Jan. 2013  . Lupus   . PAF (paroxysmal atrial fibrillation)   . Vitamin D deficiency   . Acute on chronic diastolic heart failure   . Esophageal dysmotility    Assessment: 89 YOF presents from home with SOB, starting on broad spectrum antibiotics for HCAP  although do not see where she was recently admitted.  But COPD and recent antibiotic use increases risk of possible pseudomonas.   8/10 >>vancomycin  >> 8/10 >> pip/tazo  >>    Tmax: WBCs: elevated Renal: SCr WNL but low body weight, Normalized CrCl = 10358ml/min (using Scr = 0.8)  8/10 blood: / sputum:  S. pneumo Ag: Legionella Ag:   Goal of Therapy:  Vancomycin trough level 15-20 mcg/ml  Plan:   Vancomycin 750mg  IV x 1 then  Zosyn 3.375gm IV over 30min x 1  then  Per new note from EDP, pt adamant about going home  Juliette Alcideustin Zeigler, PharmD, BCPS.   Pager: [phone]  01/01/2014,4:46 PM

## 2014-01-12 NOTE — Progress Notes (Signed)
UR [facility] and [facility]

## 2014-01-15 ENCOUNTER — Telehealth: Payer: Self-pay | Admitting: Internal Medicine

## 2014-01-15 NOTE — Telephone Encounter (Signed)
ATC x 2 Fast Busy signal 1st attempt Unable to connect/call could be connected 2nd attempt wcb

## 2014-01-16 NOTE — Telephone Encounter (Signed)
Unable to contact patient. I will send message to MW as an Lorain Childes that pt passed away. Carron Curie, CMA

## 2014-01-16 NOTE — Telephone Encounter (Signed)
ATC pt's son at # provided above to express our condolences - received msg that # could not be connected as dialed, pls try your call again x 3.   Will route msg to MW as [place]ain Childes.

## 2014-01-23 ENCOUNTER — Telehealth: Payer: Self-pay

## 2014-01-23 NOTE — Telephone Encounter (Signed)
Patient died at [facility] per [place]

## 2014-01-23 DEATH — deceased
